# Patient Record
Sex: Female | Born: 1945 | Race: Black or African American | Hispanic: No | Smoking: Current every day smoker
Health system: Southern US, Community
[De-identification: ages and names within clinical notes are randomized; demographics above are authoritative.]

## PROBLEM LIST (undated history)

## (undated) DIAGNOSIS — J449 Chronic obstructive pulmonary disease, unspecified: Secondary | ICD-10-CM

## (undated) DIAGNOSIS — N289 Disorder of kidney and ureter, unspecified: Secondary | ICD-10-CM

## (undated) DIAGNOSIS — K7460 Unspecified cirrhosis of liver: Secondary | ICD-10-CM

## (undated) HISTORY — PX: CHOLECYSTECTOMY: SHX55

---

## 1998-11-14 ENCOUNTER — Emergency Department (HOSPITAL_COMMUNITY): Admission: EM | Admit: 1998-11-14 | Discharge: 1998-11-14 | Payer: Self-pay | Admitting: Emergency Medicine

## 1998-11-14 ENCOUNTER — Encounter: Payer: Self-pay | Admitting: Emergency Medicine

## 2001-02-13 HISTORY — PX: ORIF ANKLE FRACTURE BIMALLEOLAR: SUR920

## 2001-10-10 ENCOUNTER — Emergency Department (HOSPITAL_COMMUNITY): Admission: EM | Admit: 2001-10-10 | Discharge: 2001-10-10 | Payer: Self-pay | Admitting: Physical Therapy

## 2001-11-06 ENCOUNTER — Ambulatory Visit (HOSPITAL_BASED_OUTPATIENT_CLINIC_OR_DEPARTMENT_OTHER): Admission: RE | Admit: 2001-11-06 | Discharge: 2001-11-06 | Payer: Self-pay | Admitting: Orthopedic Surgery

## 2002-01-07 ENCOUNTER — Encounter: Admission: RE | Admit: 2002-01-07 | Discharge: 2002-03-14 | Payer: Self-pay | Admitting: Orthopedic Surgery

## 2002-09-02 ENCOUNTER — Encounter: Admission: RE | Admit: 2002-09-02 | Discharge: 2002-09-02 | Payer: Self-pay | Admitting: Family Medicine

## 2002-09-02 ENCOUNTER — Encounter: Payer: Self-pay | Admitting: Family Medicine

## 2003-06-08 ENCOUNTER — Inpatient Hospital Stay (HOSPITAL_COMMUNITY): Admission: EM | Admit: 2003-06-08 | Discharge: 2003-06-11 | Payer: Self-pay | Admitting: Emergency Medicine

## 2003-10-15 ENCOUNTER — Emergency Department (HOSPITAL_COMMUNITY): Admission: EM | Admit: 2003-10-15 | Discharge: 2003-10-15 | Payer: Self-pay | Admitting: Emergency Medicine

## 2003-10-23 ENCOUNTER — Encounter: Admission: RE | Admit: 2003-10-23 | Discharge: 2003-12-09 | Payer: Self-pay | Admitting: Orthopedic Surgery

## 2003-11-17 ENCOUNTER — Emergency Department (HOSPITAL_COMMUNITY): Admission: EM | Admit: 2003-11-17 | Discharge: 2003-11-17 | Payer: Self-pay | Admitting: Emergency Medicine

## 2003-12-24 ENCOUNTER — Encounter: Admission: RE | Admit: 2003-12-24 | Discharge: 2003-12-24 | Payer: Self-pay | Admitting: General Surgery

## 2003-12-31 ENCOUNTER — Encounter: Admission: RE | Admit: 2003-12-31 | Discharge: 2004-01-29 | Payer: Self-pay | Admitting: Orthopedic Surgery

## 2004-01-25 ENCOUNTER — Inpatient Hospital Stay (HOSPITAL_COMMUNITY): Admission: EM | Admit: 2004-01-25 | Discharge: 2004-01-28 | Payer: Self-pay | Admitting: Emergency Medicine

## 2004-01-25 ENCOUNTER — Ambulatory Visit: Payer: Self-pay | Admitting: Internal Medicine

## 2004-01-26 ENCOUNTER — Encounter (INDEPENDENT_AMBULATORY_CARE_PROVIDER_SITE_OTHER): Payer: Self-pay | Admitting: *Deleted

## 2004-03-01 ENCOUNTER — Ambulatory Visit: Payer: Self-pay | Admitting: Internal Medicine

## 2004-11-10 ENCOUNTER — Emergency Department (HOSPITAL_COMMUNITY): Admission: EM | Admit: 2004-11-10 | Discharge: 2004-11-10 | Payer: Self-pay | Admitting: Emergency Medicine

## 2005-04-30 IMAGING — CR DG BE W/ AIR HIGH DENSITY
10 series · 10 of 10 positions shown · non-contrast
Comparison: none

CLINICAL DATA: Left lower quadrant abdominal pain.  Evaluate for cecal lesion questioned on recent  CT [REDACTED].
KUB:
A preliminary film of the abdomen shows a nonspecific bowel gas pattern.  Calcifications in the right upper quadrant are consistent with gallstones noted on recent CT of the abdomen. 
AIR CONTRAST BARIUM ENEMA:
Double contrast barium enema was performed. The entire colon is well visualized.  The area questioned involving the base of the cecum medially is well visualized and appears completely normal on BE with air.  The terminal ileum fills normally.  Only a single diverticulum is noted in the descending colon.  There is also a small diverticulum in the terminal ileum.  No persistent polypoid lesion or constricting lesion is seen.

[run (1 of 7)]
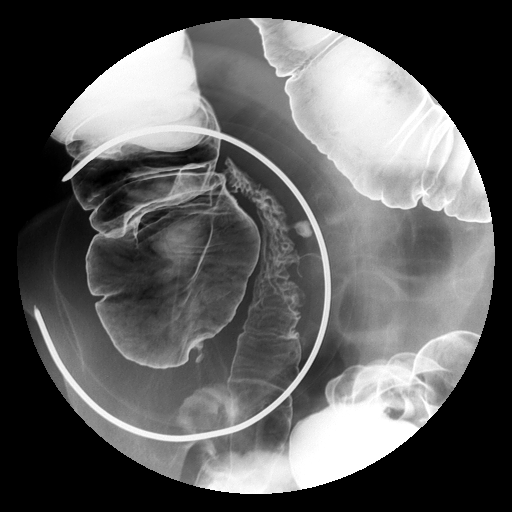

[run (2 of 7)]
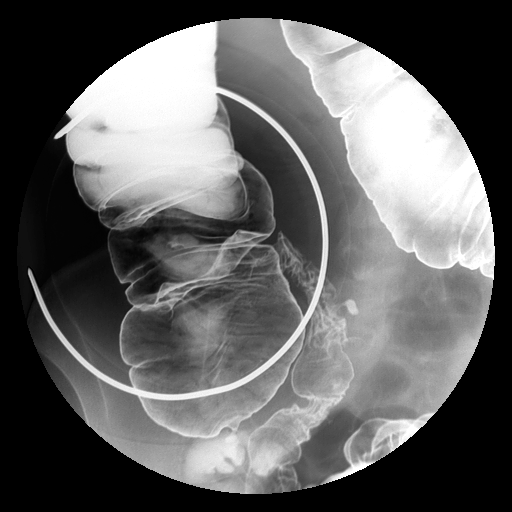

[run (3 of 7)]
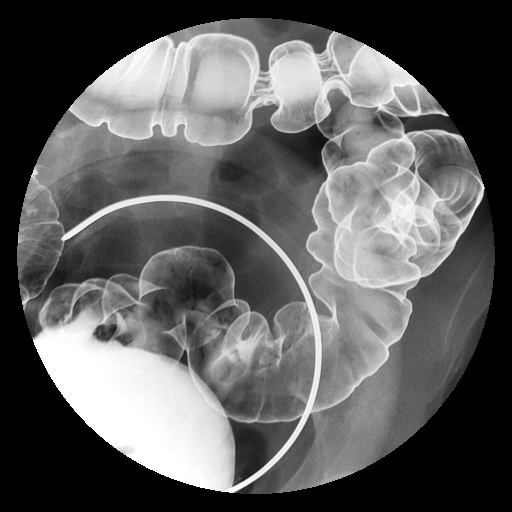

[run (4 of 7)]
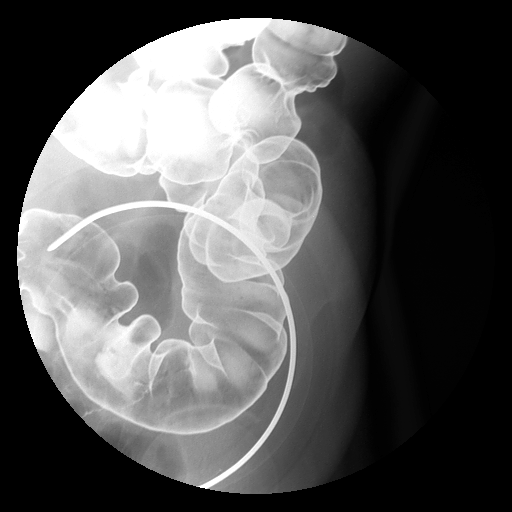

[run (5 of 7)]
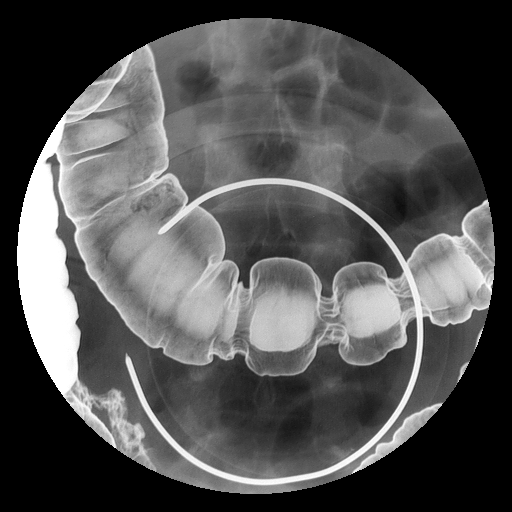

[run (6 of 7)]
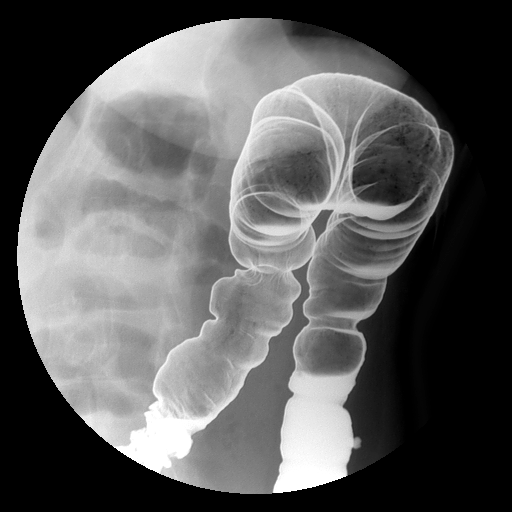

[run (7 of 7)]
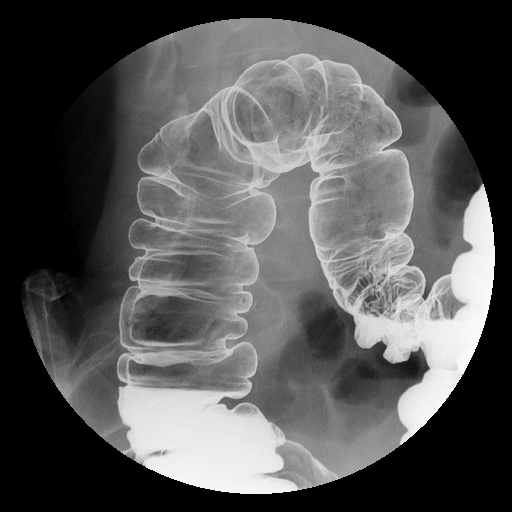

[view not recorded (1 of 3)]
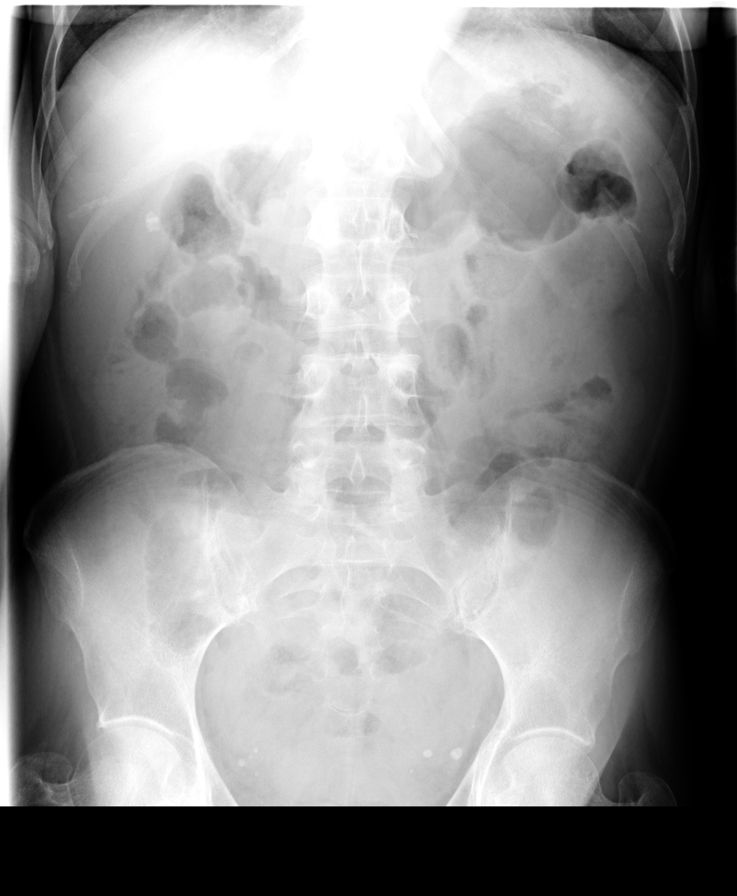

[view not recorded (2 of 3)]
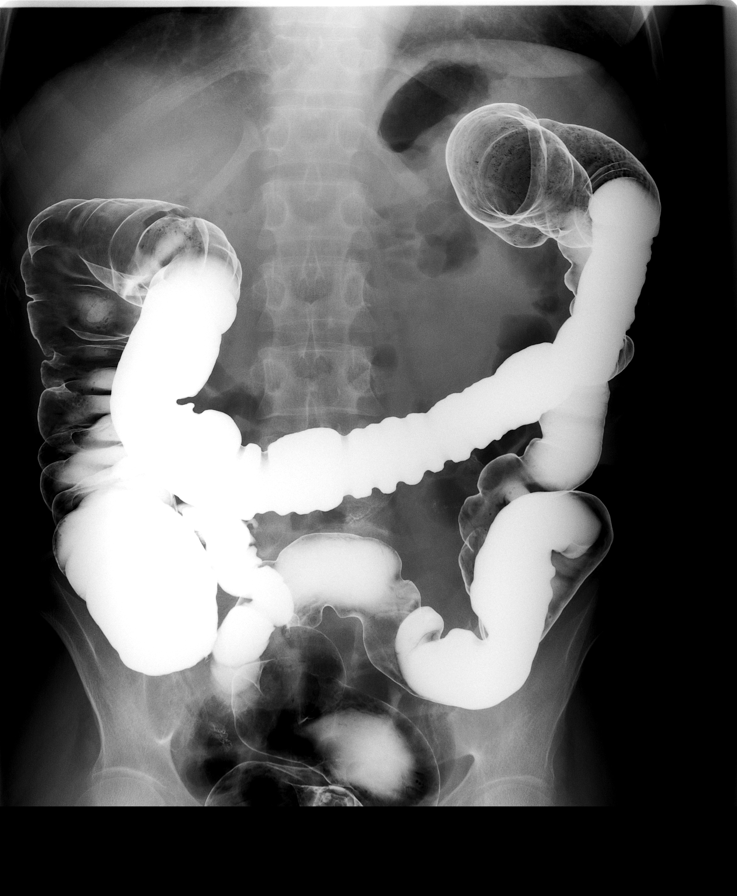

[view not recorded (3 of 3)]
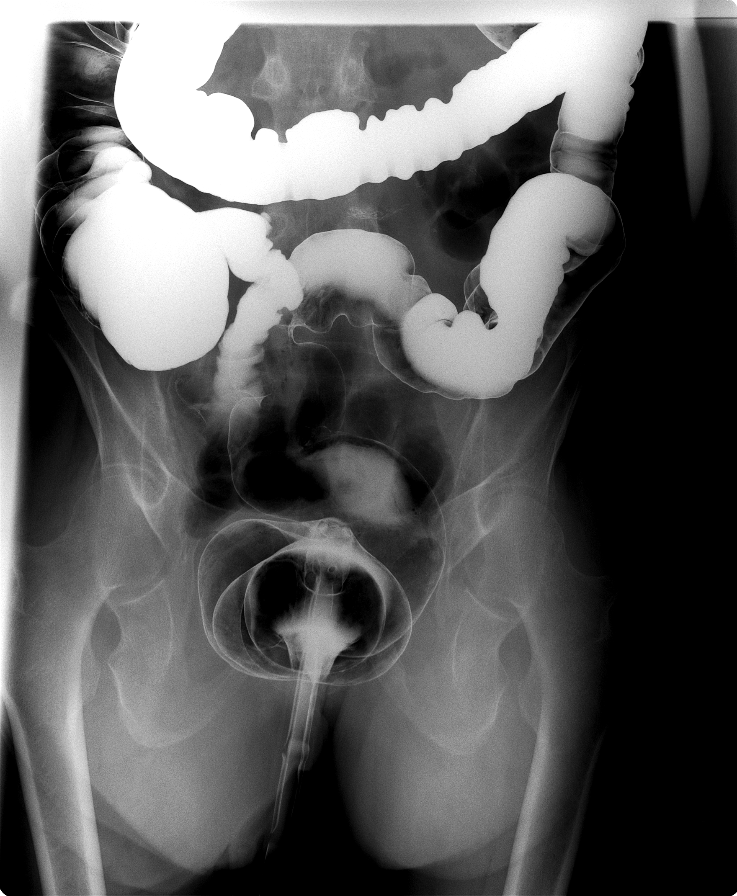

[10 of 10 positions shown; findings below may reference images not displayed]

IMPRESSION: 1.  Only a single descending colon diverticulum is noted.
2.   The base of the cecum is well visualized and appears normal at the site questioned on recent CT of the abdomen, as noted above.

## 2005-08-28 ENCOUNTER — Inpatient Hospital Stay (HOSPITAL_COMMUNITY): Admission: EM | Admit: 2005-08-28 | Discharge: 2005-08-29 | Payer: Self-pay | Admitting: Emergency Medicine

## 2005-08-29 ENCOUNTER — Encounter (INDEPENDENT_AMBULATORY_CARE_PROVIDER_SITE_OTHER): Payer: Self-pay | Admitting: Specialist

## 2005-08-29 ENCOUNTER — Ambulatory Visit: Payer: Self-pay | Admitting: Internal Medicine

## 2005-12-14 ENCOUNTER — Emergency Department (HOSPITAL_COMMUNITY): Admission: EM | Admit: 2005-12-14 | Discharge: 2005-12-14 | Payer: Self-pay | Admitting: Emergency Medicine

## 2006-07-03 ENCOUNTER — Emergency Department (HOSPITAL_COMMUNITY): Admission: EM | Admit: 2006-07-03 | Discharge: 2006-07-03 | Payer: Self-pay | Admitting: Emergency Medicine

## 2007-01-01 ENCOUNTER — Emergency Department (HOSPITAL_COMMUNITY): Admission: EM | Admit: 2007-01-01 | Discharge: 2007-01-01 | Payer: Self-pay | Admitting: Emergency Medicine

## 2007-03-08 ENCOUNTER — Ambulatory Visit (HOSPITAL_COMMUNITY): Admission: RE | Admit: 2007-03-08 | Discharge: 2007-03-08 | Payer: Self-pay | Admitting: General Surgery

## 2007-03-08 ENCOUNTER — Encounter (INDEPENDENT_AMBULATORY_CARE_PROVIDER_SITE_OTHER): Payer: Self-pay | Admitting: General Surgery

## 2007-07-28 ENCOUNTER — Emergency Department (HOSPITAL_COMMUNITY): Admission: EM | Admit: 2007-07-28 | Discharge: 2007-07-28 | Payer: Self-pay | Admitting: Emergency Medicine

## 2007-08-22 ENCOUNTER — Emergency Department (HOSPITAL_COMMUNITY): Admission: EM | Admit: 2007-08-22 | Discharge: 2007-08-22 | Payer: Self-pay | Admitting: Emergency Medicine

## 2007-11-02 ENCOUNTER — Emergency Department (HOSPITAL_COMMUNITY): Admission: EM | Admit: 2007-11-02 | Discharge: 2007-11-02 | Payer: Self-pay | Admitting: Emergency Medicine

## 2008-03-08 ENCOUNTER — Emergency Department (HOSPITAL_COMMUNITY): Admission: EM | Admit: 2008-03-08 | Discharge: 2008-03-08 | Payer: Self-pay | Admitting: Emergency Medicine

## 2009-07-14 IMAGING — CR DG KNEE COMPLETE 4+V*R*
4 series · 4 of 4 positions shown · non-contrast
Comparison: 07/28/2007

CLINICAL DATA: Fell

RIGHT KNEE - COMPLETE 4+ VIEW

[t knee ap right]
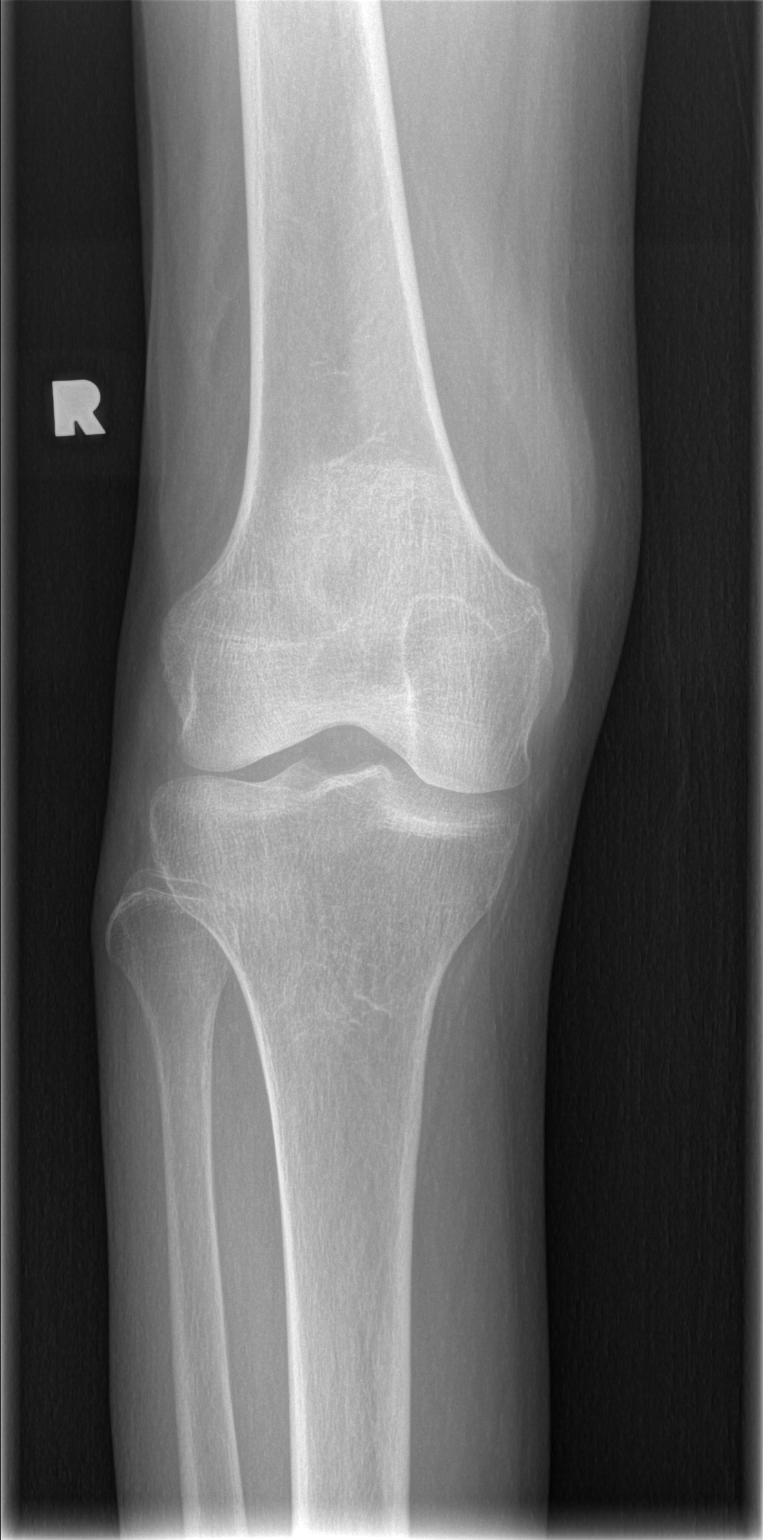

[t knee oblique right (1 of 2)]
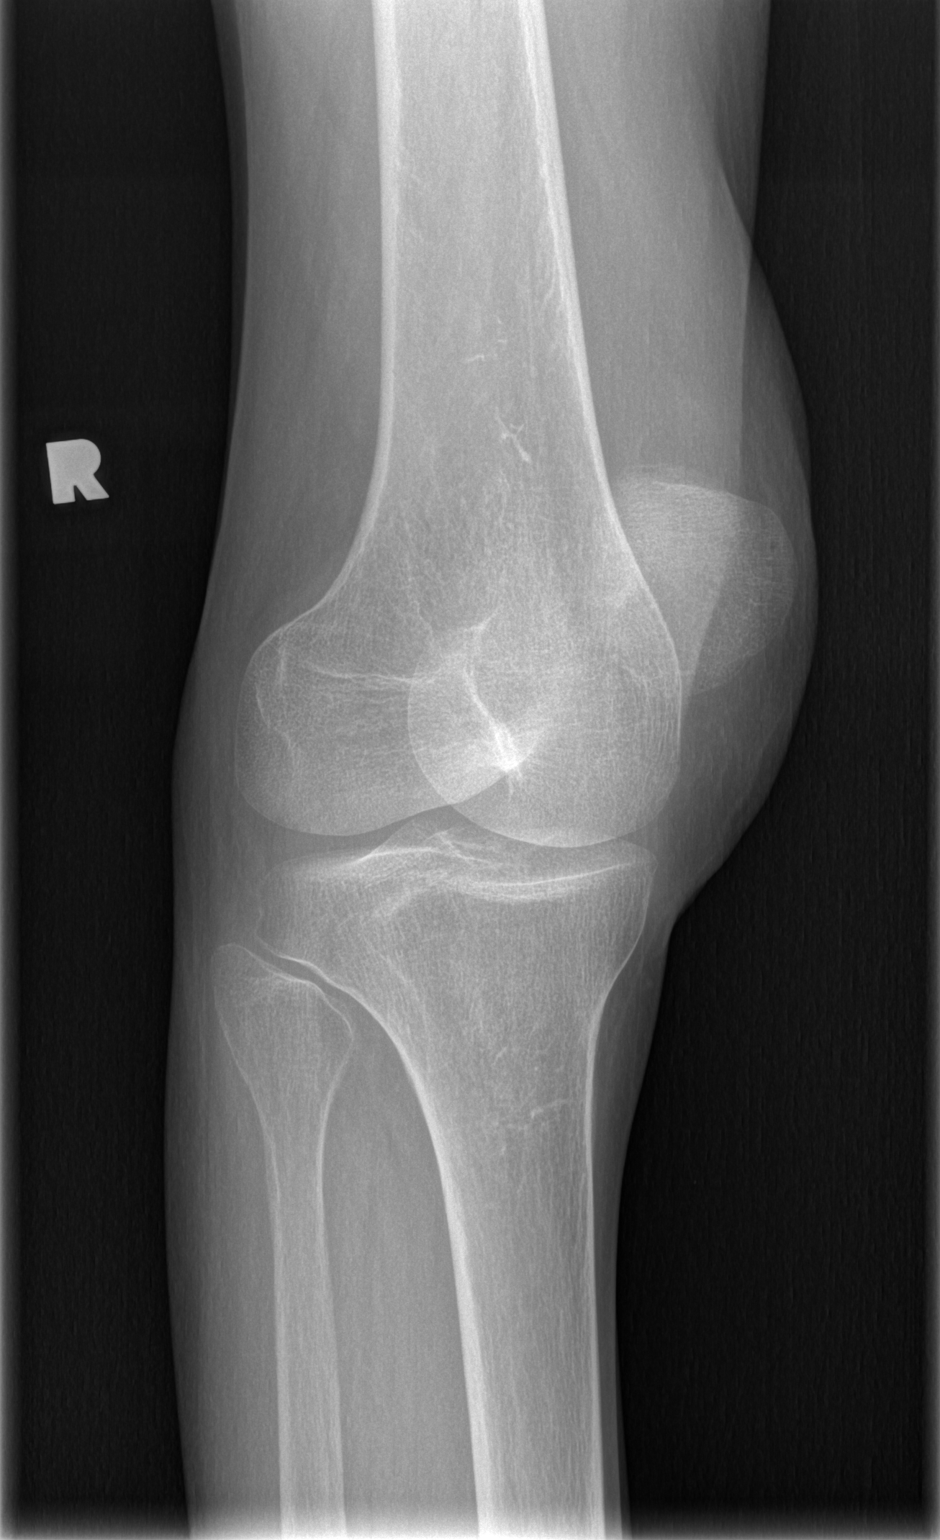

[t knee oblique right (2 of 2)]
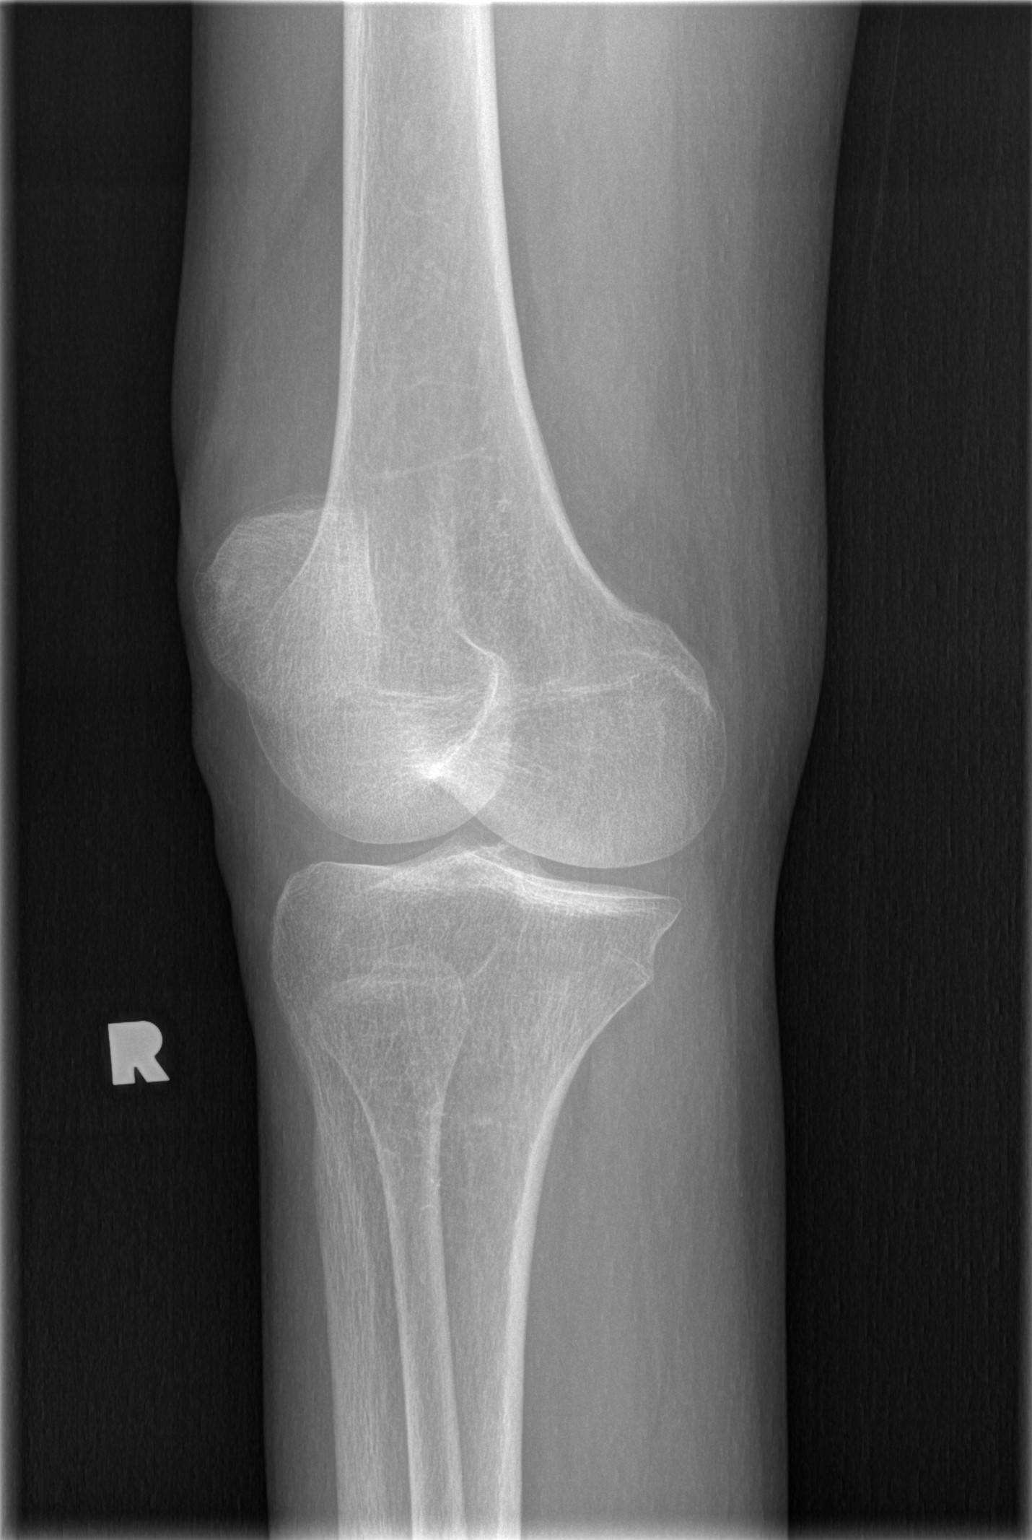

[t knee lat right]
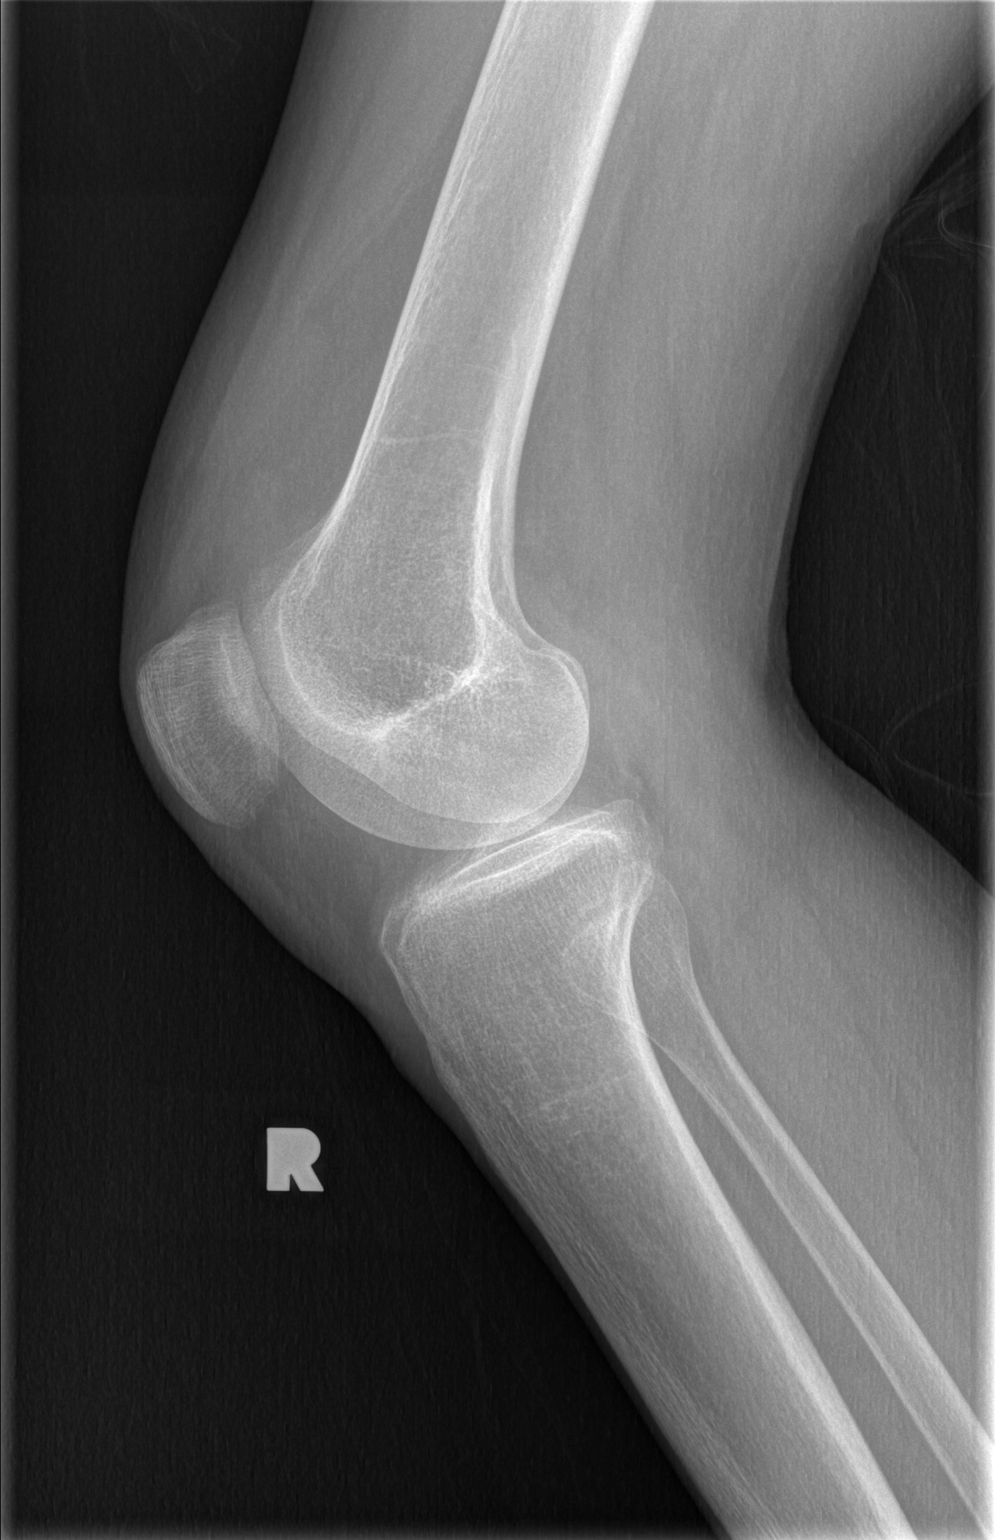

[4 of 4 positions shown; findings below may reference images not displayed]

FINDINGS: No effusion.  Mild osteopenia. Negative for fracture,
dislocation, or other acute abnormality.  Normal alignment and
mineralization. No significant degenerative change.  Regional soft
tissues unremarkable.
IMPRESSION: 1.  Negative for fracture or other acute bone injury.
2.  Osteopenia.

## 2010-05-11 ENCOUNTER — Emergency Department (HOSPITAL_COMMUNITY)
Admission: EM | Admit: 2010-05-11 | Discharge: 2010-05-11 | Disposition: A | Discharge status: Home / Self Care | Payer: Self-pay | Attending: Emergency Medicine | Admitting: Emergency Medicine

## 2010-05-11 DIAGNOSIS — H5710 Ocular pain, unspecified eye: Secondary | ICD-10-CM | POA: Insufficient documentation

## 2010-05-11 DIAGNOSIS — IMO0002 Reserved for concepts with insufficient information to code with codable children: Secondary | ICD-10-CM | POA: Insufficient documentation

## 2010-05-11 DIAGNOSIS — S058X9A Other injuries of unspecified eye and orbit, initial encounter: Secondary | ICD-10-CM | POA: Insufficient documentation

## 2010-05-11 DIAGNOSIS — H538 Other visual disturbances: Secondary | ICD-10-CM | POA: Insufficient documentation

## 2010-06-28 NOTE — Consult Note (Signed)
[name], [name]          ACCOUNT NO.:  [id number]   MEDICAL RECORD NO.:  [id number]          PATIENT TYPE:  EMS   LOCATION:  ED                           FACILITY:  [facility]   PHYSICIAN:  Angelia Mould. Derrell Lolling, M.D.DATE OF BIRTH:  10/13/1945   DATE OF CONSULTATION:  01/01/2007  DATE OF DISCHARGE:  01/01/2007                                 CONSULTATION   REFERRING PHYSICIAN:  Dr. Bethann Berkshire.   CHIEF COMPLAINT:  Abdominal pain.   HISTORY OF PRESENT ILLNESS:  This is a 65 year old black female with  gallstones.  I was asked to see her by the emergency department  physician and the emergency department PA.   The patient states that she has had 2 or 3 episodes of upper abdominal  pain over the past 12 months.  She was evaluated in [place] and told she  had gallstones and was advised to have surgery, but declined.  She now  presents to the [facility] Emergency Room with a 48-hour history of  intermittent upper abdominal pain, some nausea, but no vomiting.  She  denies fever or chills.  She is constipated.  She has never had jaundice  or hepatitis, never had any cardiac or pulmonary, or renal disease.   Ultrasound tonight shows that she has gallstones.  Her common bile duct  is not dilated.  She has borderline wall thickening.  Lab work showed a  white blood cell count of 7400 with a normal differential, lipase of 51,  and normal liver function tests, except for an AST of 83.   The patient states that she would like to go home and defer her  cholecystectomy until December.   PAST HISTORY:  1. Upper endoscopy in 2005 showing is severe esophagitis and a      possible Mallory-Weiss tear by Dr. Dorena Cookey.  2. Upper endoscopy but Dr. Lina Sar in 2007 showing a gastric      ulcer.  3. She has undergone a total abdominal hysterectomy and appendectomy.  4. She has had a fracture of her left ankle.  5. She fell and fractured her pelvis, but did not require surgery.  6. She has  hypertension.   CURRENT MEDICATIONS:  None.   DRUG ALLERGIES:  HYDROCODONE causes nausea.   FAMILY HISTORY:  Father died of a myocardial infarction and  hypertension; mother also died of a myocardial infarction and  hypertension.   SOCIAL HISTORY:  The patient is a widow.  She has 5 children; 1 child  died at AIDS.  She is unemployed.  She used to see Dr. Bruna Potter, but  really does not have a primary care physician.  She smokes 1 pack of  cigarettes per day.  She drinks a pint of alcohol or more per week.  Denies street drugs.  She lives with her niece and a friend.   REVIEW OF SYSTEMS:  Fifteen-system review of systems is performed and is  noncontributory except as described above.   PHYSICAL EXAMINATION:  GENERAL:  A thin, middle-aged black female who is  in no distress.  Her daughter is with her throughout the encounter.  VITAL SIGNS:  Temperature is 97.8, pulse 88 and regular, respirations  20, blood pressure 160/94.  HEENT:  Eyes:  Sclerae are clear.  Extraocular is intact.  Ears, nose,  mouth and throat, nose, lips, tongue and oropharynx are without gross  lesions.  NECK:  Supple, nontender.  No mass.  No jugular distention.  LUNGS:  Clear to auscultation.  No chest wall tenderness.  HEART:  Regular rate and rhythm.  No murmur.  Radial and femoral pulses  are palpable.  ABDOMEN:  Soft, not distended.  She does have some tenderness in the  epigastrium, but no peritoneal signs, guarding or mass.  She has a well-  healed Pfannenstiel incision.  There is no hernia or lower abdominal  mass.  EXTREMITIES:  She moves all 4 extremities well without pain or  deformity.  No peripheral edema.  NEUROLOGIC:  No gross motor or sensory deficits.   DATA ORDERED AND REVIEWED:  I reviewed her lab work and ultrasound as  described above.   ASSESSMENT:  1. Chronic cholecystitis with cholelithiasis.  Recurrent biliary      colic.  She will eventually need cholecystectomy.  2. Tobacco  abuse.  3. Hypertension.  4. History of recurrent upper gastrointestinal bleed, etiology      unclear, question alcohol etiology.  5. Status post abdominal hysterectomy and appendectomy.   PLAN:  1. I offered to the patient that she could be admitted to the hospital      and have her cholecystectomy at this time or defer it until a later      time.  She very clearly told me that she did not want to have this      done at this time and wanted to wait until late December after her      birthday.  I felt that this was reasonable, unless her symptoms      recurred.  2. The patient was advised that if her pain recurs, that she should      have cholecystectomy sooner to avoid complicating factors.  3. I am going to strongly advise her on a no-alcohol/no-tobacco      lifestyle.   I told her to eat a low-fat diet.   She was given a prescription for Cipro and Tylox.   She was given my business card and asked to call my office tomorrow and  set up an appointment to see me within the next few days to schedule  elective surgery.  She states that she will comply with these  recommendations.      Angelia Mould. Derrell Lolling, M.D.  Electronically Signed     HMI/MEDQ  D:  01/01/2007  T:  01/02/2007  Job:  [id number]

## 2010-06-28 NOTE — Op Note (Signed)
NAMESUMMERLYNN, GLAUSER          ACCOUNT NO.:  [id number]   MEDICAL RECORD NO.:  [id number]          PATIENT TYPE:  AMB   LOCATION:  DAY                          FACILITY:  [facility]   PHYSICIAN:  Angelia Mould. Derrell Lolling, M.D.DATE OF BIRTH:  [birth year]   DATE OF PROCEDURE:  03/08/2007  DATE OF DISCHARGE:                               OPERATIVE REPORT   PREOPERATIVE DIAGNOSIS:  Chronic cholecystitis with cholelithiasis.   POSTOPERATIVE DIAGNOSIS:  Chronic cholecystitis with cholelithiasis.   OPERATION PERFORMED:  Laparoscopic cholecystectomy with intraoperative  cholangiogram.   SURGEON:  Dr. Claud Kelp.   FIRST ASSISTANT:  Dr. Consuello Bossier.   OPERATIVE INDICATIONS:  This is a 65 year old black female who I  evaluated in the emergency room at [facility] approximately 2 months  ago.  She was having recurrent biliary colic.  This has been going on  for several years.  She declined admission at that time.  She has been  further evaluated in the office, and surgery for her gallbladder has  been recommended.  She is now brought to operating room electively.   OPERATIVE FINDINGS:  Her liver was enlarged but was not obviously  cirrhotic.  I did not see any evidence of portal hypertension.  The  gallbladder was thin-walled.  There fairly extensive adhesions to the  body and infundibulum of the gallbladder.  The cholangiogram was  basically normal showing somewhat tortuous common duct but normal  intrahepatic and extrahepatic bile duct anatomy, no filling defects, and  no obstruction with good flow of contrast into the duodenum.  There were  some adhesions in the midline beginning around the umbilicus.  We had a  little bit of bleeding from of the omental vessels which required  control with metal clips but really did not result in more than about 20  mL of blood loss or less.  We had complete hemostasis at the end of the  case.  The lower midline adhesions extended all the way down in  the  pelvis, presumably from her previous hysterectomy.  We did not see any  primary disease process of the large intestine or small intestine.   OPERATIVE TECHNIQUE:  Following the induction of general endotracheal  anesthesia, the patient's abdomen was prepped and draped in a sterile  fashion.  The patient was identified as the correct patient and correct  procedure.  Intravenous antibiotics were given.  Marcaine 0.5% with  epinephrine was used as a local infiltration anesthetic.  A vertically  oriented incision was made inside the lower rim of the umbilicus.  The  fascia was incised in the midline and the abdominal cavity entered under  direct vision.  A 10-mm Hassan trocar was inserted and secured with a  pursestring suture of 0 Vicryl.  Pneumoperitoneum was created.  Video  cam was inserted with visualization and findings as described above.  A  5-mm trocar was placed in the subxiphoid region and two 5-mm trocars  placed in the right upper quadrant.  The gallbladder fundus was  elevated.  We spent some time taking adhesions down which were soft  chronic adhesions.  We ultimately dissected out  the infundibulum of the  gallbladder and retracted that laterally.  We continued our dissection  until we had isolated the cystic duct and the cystic artery.  We  isolated the cystic artery as it went onto the wall of the gallbladder,  secured it with multiple metal clips and divided it.  This created a  large window behind the cystic duct.  A cholangiogram catheter was  placed in the cystic duct and a cholangiogram obtained using the C-arm.  The cholangiogram was normal as described above.  The cholangiogram  catheter was removed.  The cystic duct was secured with multiple metal  clips and divided.  The gallbladder was dissected from its bed, placed  in a specimen bag and removed.  The operative field was copiously  irrigated.  At the completion of the case, the irrigation fluid was   completely clear.  There was no bleeding and no bile leak whatsoever  anywhere in the operative field.  We checked the periumbilical area one  more time, and the adhesions looked fine.  There was no bleeding or  blood collections.  Trocars were removed under direct vision.  There was  no bleeding from trocar sites.  Pneumoperitoneum was released.  The  fascia at the umbilicus was closed with 0 Vicryl sutures.  Skin  incisions were closed with subcuticular sutures of 4-0 Monocryl and  Steri-Strips.  Clean bandages were placed and the patient taken to the  recovery in stable condition.  Estimated blood loss was about 20 mL.  Complications none.  Sponge, needle and instrument counts were correct.      Angelia Mould. Derrell Lolling, M.D.  Electronically Signed     HMI/MEDQ  D:  03/08/2007  T:  03/08/2007  Job:  [id number]

## 2010-07-01 NOTE — Consult Note (Signed)
NAMEMALLERIE, [name]          ACCOUNT NO.:  [id number]   MEDICAL RECORD NO.:  [id number]          PATIENT TYPE:  INP   LOCATION:  4731                         FACILITY:  [facility]   PHYSICIAN:  John C. Madilyn Fireman, M.D.    DATE OF BIRTH:  1945-08-29   DATE OF CONSULTATION:  01/25/2004  DATE OF DISCHARGE:                                   CONSULTATION   REASON FOR CONSULTATION:  Dysphagia and weight loss with one episode of  vomiting described as dark.   HISTORY OF PRESENT ILLNESS:  The patient is a 65 year old black female who  was admitted today after a syncopal episode in the bathroom not associated  with any bowel movements but having shortly after vomiting some blackish  material.  She also gives a history of progressive solid greater than liquid  dysphagia over the last 2-3 months with decreasing p.o. intake and a 20  pound weight loss.  She presented with upper GI bleeding in April and was  found to have blood in her stomach and an ill defined mucosal lesion at the  GE junction thought to represent an ulcer or Mallory-Weiss tear.  She  improved on Protonix and was doing well at the time of office follow up.  She was treated with Protonix for 2-3 months.  She first noticed her  dysphagia about 2-3 months ago and states it has been gradually worsening.  She has also felt generally weaker and has had increasing fatigue recently.   PAST MEDICAL HISTORY:  1.  Bronchitis.  2.  Mild anemia.   PAST SURGICAL HISTORY:  Hysterectomy and foot surgery.   SOCIAL HISTORY:  The patient lives with a boyfriend.  She is widowed.  She  has five children.  She drinks alcohol on the weekend and admits to chronic  cigarette smoking.  She takes [employer] fairly frequently.   PHYSICAL EXAMINATION:  GENERAL:  Somewhat thin black female alert and oriented in no acute  distress.  HEENT:  Unremarkable.  HEART:  Regular rate and rhythm without murmurs.  LUNGS:  Clear.  ABDOMEN:  Soft,  nondistended, with normal active bowel sounds, no  hepatosplenomegaly, masses, or guarding.  RECTAL:  The results of admission rectal exam not currently available.   LABORATORY DATA:  PH 7.569, pCO2 43, bicarbonate 39.  WBC 7500, hemoglobin  9, hematocrit 26.3, platelets 112,000.  Protime 13.5.   IMPRESSION:  Progressive solid greater than liquid dysphagia with weight  loss, differential diagnoses include esophageal stricture versus  gastroesophageal junction neoplasm versus achalasia versus possible  infectious esophagitis or esophageal ulcer.   PLAN:  Will proceed with esophagoscopy tomorrow.       JCH/MEDQ  D:  01/25/2004  T:  01/25/2004  Job:  [id number]   cc:   Ileana Roup, M.D.  [address] [place] [address]  Fax: [phone]

## 2010-07-01 NOTE — Op Note (Signed)
NAME:  [name], [name]                    ACCOUNT NO.:  [id number]   MEDICAL RECORD NO.:  [id number]                   PATIENT TYPE:  EMS   LOCATION:  MAJO                                 FACILITY:  [facility]   PHYSICIAN:  John C. Madilyn Fireman, M.D.                 DATE OF BIRTH:  11-04-1945   DATE OF PROCEDURE:  06/08/2003  DATE OF DISCHARGE:                                 OPERATIVE REPORT   PROCEDURE PERFORMED:  Esophagogastroduodenoscopy.   ENDOSCOPIST:  Barrie Folk, M.D.   INDICATIONS FOR PROCEDURE:  Upper gastrointestinal bleeding.   DESCRIPTION OF PROCEDURE:  The patient was placed in the left lateral  decubitus position and placed on the pulse monitor with continuous low-flow  oxygen delivered by nasal cannula.  She was sedated with 35 mcg of IV  fentanyl and 4 mg of IV Versed.  The Olympus video endoscope was advanced  under direct vision into the oropharynx and esophagus.  The esophagus was  straight and of normal caliber with the squamocolumnar line at 38 cm.  There  appeared to be an esophageal ulcer and also an avulsed area that may have  represented a Mallory-Weiss tear.  It did not appear to be bleeding at the  time of the procedure but there was significant blood in the stomach and  after inspection of the stomach and duodenum, there was some retching on the  part of the patient and there appeared to be some oozing from near the  gastroesophageal junction that could not be localized.  The stomach was  entered and a moderately large amount of dark maroon-colored liquid was  suctioned from the fundus.  I could not absolutely suction all of this due  to some small dark clots and ulcer disease in the stomach.  Also the patient  was becoming combative and was difficult to sedate but could not tolerated  increased doses of sedatives due to relative hypotension.  The visualized  portions of the stomach appeared normal including the fundus, body, and  antrum.  The pylorus was  nondeformed and easily allowed passed of the  endoscope tip into the duodenum.  There was some intense duodenitis at the  junction of the bulb and second portion with no ulcer seen and really no  blood seen in the first or second portion of the duodenum.  The scope was  then withdrawn back to the gastroesophageal junction.  The patient was  gagging and regurgitated some of the old blood in the stomach and there was  some slight oozing from the gastroesophageal junction.  I suspect she had a  Mallory-Weiss tear and/or an esophageal ulcer __________ seen on initial  intubation.  It did not appear that there was any high grade bleeding going  on and I elected to terminate the procedure at that point and treat him  medically while getting blood transfusion.  The scope was withdrawn and the  patient was  returned to the recovery room in stable condition.  The patient  tolerated the procedure relatively well.  There were no immediate  complications.   IMPRESSION:  Recent upper gastrointestinal bleeding with source suspected  gastroesophageal junction with one small ulcer seen and possible Mallory-  Weiss tear.   PLAN:  Admit for treatment with proton pump inhibitor and Carafate.  Will  check Helicobacter pylori antibody and will also transfuse.                                              John C. Madilyn Fireman, M.D.   JCH/MEDQ  D:  06/08/2003  T:  06/08/2003  Job:  [id number]   cc:   Dr. Bruna Potter

## 2010-07-01 NOTE — Discharge Summary (Signed)
NAME:  [name], [name]                    ACCOUNT NO.:  [id number]   MEDICAL RECORD NO.:  [id number]                   PATIENT TYPE:  INP   LOCATION:  3036                                 FACILITY:  [facility]   PHYSICIAN:  John C. Madilyn Fireman, M.D.                 DATE OF BIRTH:  09/15/1945   DATE OF ADMISSION:  06/08/2003  DATE OF DISCHARGE:  06/11/2003                                 DISCHARGE SUMMARY   HISTORY OF ILLNESS:  The patient is a 65 year old black female who was  admitted, on June 08, 2003, with __________  black stool, hemoglobin of  5.9, BUN 29, creatinine 0.8 with tachycardia.  For details, please see  admission history and physical.   COURSE IN THE HOSPITAL:  The patient underwent EGD which showed diffuse  bleeding from the GE junction, unable to define a discrete source, felt  probably most likely to be from a Mallory-Weiss laceration.  The patient was  started on a proton pump inhibitor and Carafate.  She was also transfused  three units of packed red blood cells and had H. pylori antibody done which  was negative.  After three units of packed red blood cells, her hemoglobin  was 10.5, remained stable, and was 11.2 on discharge.  On the second  hospital day, she was doing better with no abdominal pain or vomiting.  On  June 10, 2003, her diet was advanced and she was switched to p.o. Protonix.  On April 28, she had no stool, no abdominal pain.  Her hemoglobin was  stable.  She was discharged to outpatient followup.   DISCHARGE DIAGNOSES:  1. Upper gastrointestinal bleed, probably from a Mallory-Weiss tear or     esophageal ulcer.  2. Duodenitis.   DISCHARGE MEDICINES:  Protonix 40 mg b.i.d.   FOLLOW UP:  Dr. Madilyn Fireman, in the office, in 2-3 weeks.                                                John C. Madilyn Fireman, M.D.    JCH/MEDQ  D:  06/30/2003  T:  06/30/2003  Job:  [id number]

## 2010-07-01 NOTE — Op Note (Signed)
   [name], [name]                    ACCOUNT NO.:  [id number]   MEDICAL RECORD NO.:  [id number]                   PATIENT TYPE:  AMB   LOCATION:  DSC                                  FACILITY:  [facility]   PHYSICIAN:  Mila Homer. Sherlean Foot, M.D.              DATE OF BIRTH:  Apr 30, 1945   DATE OF PROCEDURE:  11/06/2001  DATE OF DISCHARGE:                                 OPERATIVE REPORT   PREOPERATIVE DIAGNOSES:  Left bimalleolar ankle fracture.   POSTOPERATIVE DIAGNOSES:  Left bimalleolar ankle fracture.   OPERATION PERFORMED:  Left ankle open reduction internal fixation.   SURGEON:  Mila Homer. Sherlean Foot, M.D.   ASSISTANT:  None.   ANESTHESIA:  General.   INDICATIONS FOR PROCEDURE:  The patient is five days status post bimalleolar  ankle fracture and after medical clearance and informed consent she was  taken to the operating room.   DESCRIPTION OF PROCEDURE:  The patient was laid supine and administered  general anesthesia.  The left lower extremity was prepped and draped in the  usual sterile fashion.  [name] #15 blade was used to make [name] straight lateral  incision over the lateral malleolus and continued directly down to bone on  the inferior aspect, soft tissue dissection in the proximal aspect to make  sure we did not injure the superficial peroneal nerve.  We then cleared off  the lateral aspect of the lateral malleolus and did direct plate fixation  and plate reduction of the lateral malleolus fracture.  We placed three  bicortical screws proximally, one bicortical and one cancellous screw  distally and checked under C-arm imaging and it was anatomic.  Turned my  attention to the medial side.  There was so much swelling and even inferior  fracture blister that I chose to use [name] percutaneous technique with 4-0  titanium Ace aimed screws.  I placed [name] 4-0 guidewire percutaneously under  OEC AP and lateral imaging and then placed [name] parallel one to that  posteriorly and then placed  two 35 mm partially threaded cancellous screws  there and mortise views showed anatomic reduction.  I then irrigated and  closed with interrupted 0s, 2-0s, skin staples on each side and dressed with  Adaptic, 4 x 4s, sterile Webril and Ace wrap.  I did infiltrate the lateral  wound with 10 cc of Marcaine morphine mixture with epinephrine.  Tourniquet  went down at 47 minutes.   COMPLICATIONS:  None.   DRAINS:  None.   ESTIMATED BLOOD LOSS:  Minimal.                                                Mila Homer. Sherlean Foot, M.D.   SDL/MEDQ  D:  11/06/2001  T:  11/06/2001  Job:  [id number]

## 2010-07-01 NOTE — H&P (Signed)
NAME:  [name], [name]                    ACCOUNT NO.:  [id number]   MEDICAL RECORD NO.:  [id number]                   PATIENT TYPE:  EMS   LOCATION:  MAJO                                 FACILITY:  [facility]   PHYSICIAN:  John C. Madilyn Fireman, M.D.                 DATE OF BIRTH:  11-20-1945   DATE OF ADMISSION:  06/08/2003  DATE OF DISCHARGE:                                HISTORY & PHYSICAL   CHIEF COMPLAINT:  Black stools.   HISTORY OF PRESENT ILLNESS:  The patient is a 65 year old, African-American  female who developed bloody or coffee-grounds emesis today and noted black  stool and was apparently near syncopal, according to EMS, and was brought to  the emergency room.  She denied any abdominal pain.  She was initially  hypotensive with a hemoglobin of 5.9, black heme positive stool with a BUN  of 29 and creatinine 0.8.  She had normal amylase and negative detectable  alcohol.  Platelet count was 116,000.  She was tachycardic in the 120s and  initially systolic blood pressure was in the 80s, but responded to fluid  challenge.  She denies any prior history of GI bleeding, peptic ulcer  disease and really has no significant past medical history.   PAST MEDICAL HISTORY:  History of bronchitis.   PAST SURGICAL HISTORY:  1. Hysterectomy in 1979.  2. Foot surgery in 2003.   SOCIAL HISTORY:  The patient lives with her boyfriend.  She has five  children.  She drinks liquor on the weekends.  She admits to cigarette  smoking and takes Marlin Canary powders fairly frequently.   MEDICATIONS:  None.   ALLERGIES:  None.   FAMILY HISTORY:  Both parents died of heart disease.  No family history of  GI malignancy or ulcer disease.   PHYSICAL EXAMINATION:  GENERAL:  Well-developed, well-nourished, African-  American female in no acute distress.  VITAL SIGNS:  Blood pressure 92/68, pulse 120.  HEART:  Regular rate and rhythm without murmur.  LUNGS:  Clear.  ABDOMEN:  Soft, nondistended with  normoactive bowel sounds.  No  hepatosplenomegaly, masses or guarding.   LABORATORY DATA AND X-RAY FINDINGS:  Hemoglobin 5.9, hematocrit 18.1, MCV  89.9, WBC 10,800, platelet count 116,000.   IMPRESSION:  Suspected upper gastrointestinal bleed probably due to peptic  ulcer disease induced by using nonsteroidal anti-inflammatory drugs.   PLAN:  1. Admit and transfuse.  2. Proceed with endoscopy today.                                                John C. Madilyn Fireman, M.D.    JCH/MEDQ  D:  06/08/2003  T:  06/08/2003  Job:  [id number]

## 2010-07-01 NOTE — Op Note (Signed)
[name], RANKIN          ACCOUNT NO.:  [id number]   MEDICAL RECORD NO.:  [id number]          PATIENT TYPE:  INP   LOCATION:  2922                         FACILITY:  [facility]   PHYSICIAN:  Bernette Redbird, M.D.   DATE OF BIRTH:  November 14, 1945   DATE OF PROCEDURE:  08/29/2005  DATE OF DISCHARGE:                                 OPERATIVE REPORT   PROCEDURE:  Upper endoscopy with biopsies.   INDICATION:  This is a 65 year old female who had a GI bleed about two years  ago, at which time endoscopy by Dr. Madilyn Fireman showed a possible Mallory-Weiss  tear.  She was admitted to the hospital yesterday with a history of the  abdominal pain, coffee-ground emesis, and melenic stool with a hemoglobin  after hydration of approximately 9.  She uses aspirin regularly for  treatment of pain in her knee and also drinks alcohol on a regular basis  alcohol.   FINDINGS:  Non-bleeding gastric ulcer.  Diffuse peptic erythema in the  antrum and duodenum.   DESCRIPTION OF PROCEDURE:  The nature, purpose, and risks of the procedure  have been reviewed with the patient by Dr. Evette Cristal and myself, and she  provided written consent.  She was brought in a fasted state from her  hospital room to the endoscopy unit and given sedation with Phenergan 25 mg  intravenously, Fentanyl 50 mcg intravenously, and Versed 4 mg intravenously  without arrhythmias or desaturation.  The patient was mildly uncooperative  and restless during the exam but was not frankly combative.  We did have  several pupils helping to hold her down to help make sure that she did not  create a problem as she had on her prior exam when apparently the exam was  difficult for Dr. Madilyn Fireman due to patient cooperation.   The Olympus video endoscope was passed under direct vision.  The vocal cords  were not well seen.  The esophagus was entered without undue difficulty, and  at this time, the esophagus was normal without any evidence of Mallory-Weiss  tear,  reflux esophagitis, Barrett's esophagus, varices, infection, or  neoplasia nor any ring stricture or significant hiatal hernia.   The stomach contained no residual and specifically no blood or coffee-ground  material.  The antrum of the stomach had a moderate amount of edema of the  folds and erythema.  After examining this area for some time, I was able to  identify a roughly 1 cm diameter, moderately-deep ulcer with a small stigma  of hemorrhage but no visible vessel, in between edematous folds.  It was not  readily apparent on initial inspection of the antrum.  It was on the greater  curve aspect of the antrum, probably 5 cm or so proximal to the pylorus.   The pylorus was patent but rather spastic.  The duodenal bulb and proximal  duodenum had a lot of edema of the mucosa and folds and quite a bit of  erythema, but I did not see any erosions or ulcers in the duodenum.  Duodenal biopsies were obtained and also antral biopsies, looking for H.  pylori infection.  Retroflexed  viewing of cardia of the stomach was  unremarkable.   The scope was removed from the patient who tolerated the procedure well and  without apparent complication.   IMPRESSION:  1.  Gastric ulcer with stigma of recent hemorrhage, presumably accounting      for the patient's recent gastrointestinal bleeding but without evidence      of active bleeding (531.00).  2.  Peptic erythema of antrum and duodenum.   DISCUSSION:  The observed appearance could be related to the patient's use  of aspirin and/or inflammation related to alcohol exposure.   RECOMMENDATIONS:  1.  Add sucralfate while the patient is in-house.  I would use 1 gram four      times a day.  2.  Endoscopically, the patient appears to be at low risk for re-bleeding,      so I believe the patient could be discharged as soon as her abdominal      pain symptoms permit.  3.  I would treat her with twice daily PPI therapy for approximately a      month,  after which I would keep her on daily dosing for at least an      additional month or, if she remains on aspirin products, I would keep      her on PPI therapy once daily forever as ulcer prophylaxis.  The least      expensive PPI for this patient is probably Prilosec OTC unless she has a      drug coverage plan.  4.  Await biopsies and treat for H. pylori if they come back positive.  5.  In addition to arranging an early follow-up visit with her primary      physician (to monitor her hemoglobin, her symptoms, and her medication      compliance), I would make a follow-up appointment with Dr. Dorena Cookey      who has seen her previously in the office for GI, for approximately a      month from now, at which time consideration could be given to arranging      a follow-up endoscopy to confirm ulcer healing.  6.  The most important aspect of this patient's care is finding a suitable      alternative to aspirin for control of her knee pain.  7.  The patient indicates that she has had a recent examination of her colon      arranged through her primary physician.  It sounds like this was a      barium enema.  I do not think we need of further lower GI workup at this      time, and I would defer colon cancer screening decisions in the future      to her primary physician.   We appreciate the opportunity to have seen this patient with you.  Please  call us if you have questions or we can be of further assistance.           ______________________________  Bernette Redbird, M.D.     RB/MEDQ  D:  08/29/2005  T:  08/29/2005  Job:  [id number]   cc:   Dr. Leda Roys. Madilyn Fireman, M.D.  Fax: [phone]

## 2010-07-01 NOTE — Op Note (Signed)
NAMEGIUSEPPINA, [name]          ACCOUNT NO.:  [id number]   MEDICAL RECORD NO.:  [id number]          PATIENT TYPE:  INP   LOCATION:  4731                         FACILITY:  [facility]   PHYSICIAN:  John C. Madilyn Fireman, M.D.    DATE OF BIRTH:  [birth year]   DATE OF PROCEDURE:  01/26/2004  DATE OF DISCHARGE:                                 OPERATIVE REPORT   PROCEDURE:  Esophagogastroduodenoscopy with biopsy.   INDICATION FOR PROCEDURE:  Dysphagia, odynophagia, anemia, and heme-positive  stools.   PROCEDURE:  The patient was placed in the left lateral decubitus position  and placed on the pulse monitor with continuous low-flow oxygen delivered by  nasal cannula.  She was sedated with 75 mcg of IV fentanyl and 7.5 mg of IV  Versed.  The Olympus video endoscope was advanced under direct vision into  the oropharynx and esophagus.  The esophagus was straight and of normal  caliber with the squamocolumnar line at 37 cm above a 2 cm hiatal hernia.  There were at least three discrete erosions with exudate or shallow ulcers  near the GE junction consistent with grade 3 erosive esophagitis.  There was  no definite ring or stricture, although I could not rule out some degree of  slight narrowing.  The scope passed through the GE junction easily and a  small amount of liquid secretions was suctioned from the fundus.  Retroflexed view of the cardia confirmed a small hiatal hernia but was  otherwise unremarkable.  The fundus and body appeared normal.  The proximal  antrum appeared normal.  There was some exaggeration of prepyloric folds,  and it was somewhat difficult to view the pylorus directly but it was fairly  easy to advance through the folds into the duodenum.  The duodenal bulb had  a grossly abnormal appearance with cobblestone appearance with large amounts  of heaped-up mucosa that may have represented Brunner gland hyperplasia,  pseudopolyps, or ectopic gastric or pancreatic tissue, and multiple  biopsies  were taken.  The postbulbar duodenum appeared somewhat less abnormal but  there was some flattening of the valvulae.  The scope was withdrawn back  into the stomach and a CLOtest obtained.  The scope was then withdrawn and  the patient returned to the recovery room in stable condition.  She  tolerated the procedure well, and there were no immediate complications.   IMPRESSION:  1.  Moderately severe erosive esophagitis.  2.  Marked duodenitis, see above for differential diagnosis.   PLAN:  1.  Will continue double-dose proton pump inhibitor and try some liquid      Carafate.  2.  Await all biopsy results and CLOtest.       JCH/MEDQ  D:  01/26/2004  T:  01/26/2004  Job:  [id number]   cc:   Ileana Roup, M.D.  [address] [place] [address]  Fax: [phone]

## 2010-07-01 NOTE — Consult Note (Signed)
[name], [name]          ACCOUNT NO.:  [id number]   MEDICAL RECORD NO.:  [id number]          PATIENT TYPE:  INP   LOCATION:  1825                         FACILITY:  [facility]   PHYSICIAN:  Graylin Shiver, M.D.   DATE OF BIRTH:  1945-05-01   DATE OF CONSULTATION:  08/28/2005  DATE OF DISCHARGE:                                   CONSULTATION   REFERRING PHYSICIAN:  Alvester Morin, M.D.   REASON FOR CONSULTATION:  The patient is a 65 year old female admitted via  the emergency room at [facility] with complaints of feeling bad for  several day.  She states that 3 days ago she had a dark, blackish-greenish  stool and yesterday had some coffee-grounds emesis.  She has also been  experiencing epigastric abdominal discomfort for the past several days.  She  does have a history of erosive esophagitis diagnosed by endoscopy in the  past.   PAST MEDICAL HISTORY:  History of erosive esophagitis in the past.   SURGICAL HISTORY:  Hysterectomy.   ALLERGIES:  None known.   MEDICATIONS:  BC powders.   SOCIAL HISTORY:  She drinks alcohol regularly and smokes cigarettes.   REVIEW OF SYSTEMS:  No chest pain, shortness of breath, cough or sputum  production.  She states that she has been getting a little dizzy the last  few days.   LABORATORY DATA:  Hemoglobin originally was 12 and after hydration dropped  to 9.7.   PHYSICAL EXAMINATION:  VITAL SIGNS:  Stable.  She does not appear any acute  distress.  She is sitting comfortably in bed in the emergency room, watching  television.  She is in no distress.  NECK:  Supple.  HEART:  Regular rhythm.  No murmurs.  LUNGS:  Clear.  ABDOMEN:  Bowel sounds are normal.  It is soft.  There is some tenderness in  the epigastrium, but no rebound, guarding, or splenomegaly   IMPRESSION:  1.  Upper gastrointestinal bleed, suspect that this is either secondary to      erosive esophagitis, recurrent peptic ulcer disease or erosive      gastritis.   This is probably all being aggravated by continued use of      alcohol and also BC powder.  2.  History of erosive esophagitis.  3.  History of alcohol abuse.   PLAN:  The patient is being admitted to the hospital to the [facility] Teaching  [facility].  We will follow from a GI standpoint.  The patient will be  transfused as needed.  I would recommend Protonix IV.  We will set her up  for an EGD tomorrow to evaluate the upper GI tract.           ______________________________  Graylin Shiver, M.D.     SFG/MEDQ  D:  08/28/2005  T:  08/29/2005  Job:  [id number]   cc:   Alvester Morin, M.D.  Fax: [phone]   Everardo All. Madilyn Fireman, M.D.  Fax: [phone]

## 2010-07-01 NOTE — Discharge Summary (Signed)
[name], [name]          ACCOUNT NO.:  [id number]   MEDICAL RECORD NO.:  [id number]          PATIENT TYPE:  INP   LOCATION:  4731                         FACILITY:  [facility]   PHYSICIAN:  Ileana Roup, M.D.  DATE OF BIRTH:  1945-11-23   DATE OF ADMISSION:  01/25/2004  DATE OF DISCHARGE:  01/28/2004                                 DISCHARGE SUMMARY   DISCHARGE DIAGNOSES:  1.  Moderately severe erosive esophagitis.  2.  Marked duodenitis.  3.  Hematemesis.  4.  Syncope.  5.  Dysphagia.  6.  History of alcohol abuse.  7.  Anemia.   DISCHARGE MEDICATIONS:  1.  Protonix 40 mg p.o. b.i.d.  2.  Carafate 1 g p.o. before every meal and h.s.  3.  Ferrous gluconate 325 mg p.o. daily.  4.  Colace 100 mg p.o. daily p.r.n. constipation.   FOLLOWUP APPOINTMENTS:  1.  February 04, 2004 at 2:30 p.m. at the [facility] at [facility].  2.  March 14, 2004 with Dr. Madilyn Fireman, gastroenterology.   PROCEDURES PERFORMED DURING ADMISSION:  1.  Esophagogastroduodenoscopy with biopsy.  2.  CT scan of the head on January 26, 2004.  Findings:  Atrophy with no      evidence for acute intracranial abnormality.   CONSULTATIONS DURING THE ADMISSION:  Dr. Everardo All. Madilyn Fireman, gastroenterology.   CONDITION AT DISCHARGE:  Much improved.   BRIEF ADMITTING HISTORY AND PHYSICAL:  [name] is a 65 year old  woman with a past medical history of upper GI bleeding in April of 2005 with  questionable Mallory-Weiss tear, chronic alcohol abuse, who presented on the  morning of January 25, 2004 after a syncopal episode that occurred while  she was on the toilet.  This episode was preceded 3 hours previously by an  episode of frank hematemesis.  She denied any palpitations, loss of  sensation or motor ability with this syncopal episode and there was no  reported seizure-like activity, chest pain or shortness of breath.  She was  found by her boyfriend when he heard a thump and moaning  come from the  bathroom.  She denied striking her head on the floor or wall and she does  note a history of 1 similar episode that occurred in April of 2005 that led  to a diagnosis of esophagitis.  Of note, she also reports approximately 3  months of progressive dysphagia from solids to liquids and decreased p.o.  intake with about a 30-pound weight loss over that time.  She denies any  melena, hematochezia or constipation.   PHYSICAL EXAM:  VITAL SIGNS:  Temperature 97.8, pulse 90, blood pressure  103/74, respirations 20, O2 SATS 97% on room air.  GENERAL:  A cachectic female, but alert and oriented, and in no acute  distress.  HEENT:  Pupils equal, round and reactive to light.  Extraocular movements  intact and benign.  Oropharynx is clear with no exudate.  NECK:  No JVD, no thyromegaly.  Neck is supple with no lymphadenopathy.  RESPIRATORY:  Minimal bibasilar crackles.  CARDIOVASCULAR:  Regular rate and rhythm,  no murmurs, rubs, or gallops.  GI:  Positive bowel sounds.  Non-distended, slight tenderness to percussion  in the left lower quadrant.  RECTAL:  She is fecal occult-blood-positive.  EXTREMITIES:  There is peripheral wasting, but 2+ pulses present bilaterally  and no cords or erythema.  SKIN:  Skin is warm and dry, but with poor turgor.  LYMPHATICS:  There is no lymphadenopathy.  NEUROLOGIC:  Strength is 4/5 throughout. Cranial nerves II-XII are intact  grossly and there is normal sensation and motor abilities with no focal  neurologic signs.   LABORATORIES ON ADMISSION:  Sodium 141, potassium 3.1, chloride 95, BUN 3,  creatinine 1.1, glucose 162.  White blood cell count 7.7, hemoglobin 10.0,  platelets 123,000.  UDS negative.  ETOH level less than 5.  ABG:  PCO2 40.7,  pH of 7.63, bicarb 43.5.   Chest x-ray showed no active lung disease.   HOSPITAL COURSE:  PROBLEM #1 - SYNCOPE:  Given the history of decreased p.o.  intake, hematemesis and a history of GI bleeding, it  was felt this was most  likely secondary to volume depletion.  In order to rule out cardiogenic or  neurogenic causes, she was admitted to telemetry, cardiac enzymes were  cycled and a head CT was performed.  The results of all of these studies  returned normal.  There were no events on telemetry throughout the admission  and she denied any further presyncope or syncopal events.   PROBLEM #2 - DYSPHAGIA:  Given the progressive nature, this was concerning  for stricture versus carcinoma or achalasia.  GI consult was requested.  She  was made n.p.o. and an EGD was performed on hospital day 2 with results  showing moderately severe erosive esophagitis and duodenitis.  A biopsy was  performed and the results of that showed Brunner gland hyperplasia  consistent with duodenitis, but no adenomatous changes or signs of  malignancy.  H. pylori test was negative.  She was started on Protonix  b.i.d. as well as Carafate and will continue these at the time of discharge.  She noted that she was tolerating p.o. intake, solids and liquids, much  better at the time of discharge.   PROBLEM #3 - HISTORY OF GASTROINTESTINAL BLEED AND NEW HEMATEMESIS:  She was  typed and crossed 2 units, and transfused.  CBCs were followed every 8  hours.  She responded well to transfusion.  There were no further episodes  of emesis during her admission.  Results of the endoscopy were mentioned  above.  Iron studies were obtained; ferritin was 51, iron level was 275.  It  was felt that her anemia was likely secondary to occult bleeding as well as  acute hematemesis, therefore she was started on ferrous gluconate to augment  her iron stores.  This will be continued for 1 month.  She will follow up in  the clinic at [facility] in 1 week to recheck a CBC.   PROBLEM #4 - HISTORY OF ALCOHOL ABUSE:  She was on thiamine and folate throughout the admission, and was advised to refrain from consuming alcohol  and seek counseling at  [facility].   PROBLEM #5 - HYPOKALEMIA:  Her potassium was repleted as necessary  throughout the admission.   PROBLEM #6 - METABOLIC ALKALOSIS:  [name] had profound metabolic  alkalosis with a pH of 7.63 on admission, bicarb over 40.  As she was  rehydrated, her bicarb normalized and alkalosis improved.   PROBLEM #7 - CACHEXIA:  In order to address the 30-pound weight loss, Ms.  [name] was advised to use Ensure supplements 2-3 times daily as she can  tolerate.  Her nausea and vomiting were much improved, and p.o. intake was  improving as well.   LABORATORIES AT THE TIME OF DISCHARGE:  TSH 0.806.  B12 591, ferritin 51.  Sodium 136, potassium 4.1, chloride 111, bicarb 22, BUN 6, creatinine 1.0,  glucose 102.  White count 8.3, hemoglobin 9.8, platelets 72,000.  Stool  Giardia negative.  Cryptosporidium negative.  H. pylori negative.       BM/MEDQ  D:  01/28/2004  T:  01/28/2004  Job:  [id number]   cc:   Everardo All. Madilyn Fireman, M.D.  [address]  [place]  [place] [address]  Fax: [phone]

## 2010-11-03 LAB — COMPREHENSIVE METABOLIC PANEL
ALT: 72 — ABNORMAL HIGH
AST: 121 — ABNORMAL HIGH
Albumin: 3.4 — ABNORMAL LOW
CO2: 25
Chloride: 111
Creatinine, Ser: 1.16
GFR calc Af Amer: 57 — ABNORMAL LOW
GFR calc non Af Amer: 47 — ABNORMAL LOW
Potassium: 5.4 — ABNORMAL HIGH
Sodium: 142
Total Bilirubin: 0.4

## 2010-11-03 LAB — URINALYSIS, ROUTINE W REFLEX MICROSCOPIC
Bilirubin Urine: NEGATIVE
Glucose, UA: NEGATIVE
Ketones, ur: NEGATIVE
Nitrite: NEGATIVE
Protein, ur: NEGATIVE
pH: 5.5

## 2010-11-03 LAB — DIFFERENTIAL
Basophils Absolute: 0.2 — ABNORMAL HIGH
Eosinophils Absolute: 0.1
Eosinophils Relative: 2
Lymphocytes Relative: 26
Monocytes Absolute: 0.5

## 2010-11-03 LAB — CBC
MCV: 91.8
Platelets: 161
RBC: 3.47 — ABNORMAL LOW
WBC: 6.2

## 2010-11-03 LAB — POTASSIUM: Potassium: 4.2

## 2010-11-10 LAB — POCT I-STAT, CHEM 8
BUN: 18
Hemoglobin: 12.6
Potassium: 3.9
Sodium: 138
TCO2: 20

## 2010-11-10 LAB — DIFFERENTIAL
Basophils Absolute: 0
Basophils Relative: 1
Eosinophils Absolute: 0.1
Eosinophils Absolute: 0
Eosinophils Relative: 2
Lymphocytes Relative: 38
Lymphocytes Relative: 15
Neutrophils Relative %: 47
Neutrophils Relative %: 59

## 2010-11-10 LAB — CBC
HCT: 33.5 — ABNORMAL LOW
HCT: 37.6
Hemoglobin: 12.4
MCHC: 32.9
MCV: 97.2
Platelets: 101 — ABNORMAL LOW
RDW: 17.9 — ABNORMAL HIGH
RDW: 16.9 — ABNORMAL HIGH

## 2010-11-10 LAB — URINE MICROSCOPIC-ADD ON

## 2010-11-10 LAB — COMPREHENSIVE METABOLIC PANEL
Alkaline Phosphatase: 73
BUN: 11
Calcium: 8.6
Glucose, Bld: 99
Potassium: 3.6
Total Protein: 7

## 2010-11-10 LAB — URINALYSIS, ROUTINE W REFLEX MICROSCOPIC
Leukocytes, UA: NEGATIVE
Nitrite: NEGATIVE
Protein, ur: 100 — AB
Urobilinogen, UA: 0.2

## 2010-11-10 LAB — LIPASE, BLOOD: Lipase: 44

## 2010-11-14 LAB — POCT I-STAT, CHEM 8
BUN: 27 — ABNORMAL HIGH
Chloride: 113 — ABNORMAL HIGH
Creatinine, Ser: 1.4 — ABNORMAL HIGH
Potassium: 4.8
Sodium: 141

## 2010-11-14 LAB — DIFFERENTIAL
Eosinophils Relative: 3
Lymphocytes Relative: 28
Lymphs Abs: 2
Neutro Abs: 4.3
Neutrophils Relative %: 62

## 2010-11-14 LAB — POCT CARDIAC MARKERS
CKMB, poc: 1.8
CKMB, poc: <1 — ABNORMAL LOW
Myoglobin, poc: 64.9
Troponin i, poc: <0.05
Troponin i, poc: <0.05

## 2010-11-14 LAB — CBC
HCT: 29.2 — ABNORMAL LOW
Platelets: 175
WBC: 7

## 2010-11-22 LAB — COMPREHENSIVE METABOLIC PANEL
Albumin: 3.6
BUN: 17
Chloride: 113 — ABNORMAL HIGH
Creatinine, Ser: 0.97
Glucose, Bld: 95
Total Bilirubin: 1
Total Protein: 6.6

## 2010-11-22 LAB — DIFFERENTIAL
Basophils Absolute: 0
Basophils Relative: 0
Lymphocytes Relative: 20
Monocytes Absolute: 0.4
Neutro Abs: 5.4
Neutrophils Relative %: 73

## 2010-11-22 LAB — CBC
HCT: 34.1 — ABNORMAL LOW
Hemoglobin: 11.7 — ABNORMAL LOW
MCV: 93.9
Platelets: 143 — ABNORMAL LOW
RDW: 16.1 — ABNORMAL HIGH

## 2011-05-12 ENCOUNTER — Emergency Department (INDEPENDENT_AMBULATORY_CARE_PROVIDER_SITE_OTHER): Payer: Medicare Other

## 2011-05-12 ENCOUNTER — Emergency Department (INDEPENDENT_AMBULATORY_CARE_PROVIDER_SITE_OTHER)
Admission: EM | Admit: 2011-05-12 | Discharge: 2011-05-12 | Disposition: A | Discharge status: Home / Self Care | Payer: Medicare Other | Source: Home / Self Care | Attending: Emergency Medicine | Admitting: Emergency Medicine

## 2011-05-12 ENCOUNTER — Encounter (HOSPITAL_COMMUNITY): Payer: Self-pay

## 2011-05-12 DIAGNOSIS — M25519 Pain in unspecified shoulder: Secondary | ICD-10-CM

## 2011-05-12 DIAGNOSIS — M25511 Pain in right shoulder: Secondary | ICD-10-CM

## 2011-05-12 MED ORDER — MELOXICAM 7.5 MG PO TABS: 7.5000 mg | ORAL_TABLET | Freq: Every day | ORAL | Status: DC

## 2011-05-12 NOTE — ED Provider Notes (Signed)
History     CSN: [id number]  Arrival date & time 05/12/11  4098   First MD Initiated Contact with Patient 05/12/11 0845      Chief Complaint  Patient presents with  . Shoulder Pain    (Consider location/radiation/quality/duration/timing/severity/associated sxs/prior treatment) HPI Comments: Been having R shoulder pain for abut 3 months its been getting progressively worse" During this last week i cant even raise my arm up; No numbness or weakness of fingers. No recent injury, falls or trauma  Patient is a 66 y.o. female presenting with shoulder pain. The history is provided by the patient.  Shoulder Pain This is a new problem. The problem occurs constantly. The problem has not changed since onset.Pertinent negatives include no chest pain and no shortness of breath. Exacerbated by: lifting R arm. The symptoms are relieved by nothing. She has tried nothing for the symptoms. The treatment provided no relief.    History reviewed. No pertinent past medical history.  Past Surgical History  Procedure Date  . Cholecystectomy     History reviewed. No pertinent family history.  History  Substance Use Topics  . Smoking status: Current Everyday Smoker -- 0.5 packs/day for 50 years    Types: Cigarettes  . Smokeless tobacco: Not on file  . Alcohol Use: Yes    OB History    Grav Para Term Preterm Abortions TAB SAB Ect Mult Living                  Review of Systems  Constitutional: Negative for fever, appetite change and fatigue.  Respiratory: Negative for chest tightness and shortness of breath.   Cardiovascular: Negative for chest pain.  Musculoskeletal:       R shoulder pain posterior and lateral  Skin: Negative for color change, pallor, rash and wound.    Allergies  Percocet and Vicodin  Home Medications   Current Outpatient Rx  Name Route Sig Dispense Refill  . MELOXICAM 7.5 MG PO TABS Oral Take 1 tablet (7.5 mg total) by mouth daily. 14 tablet 0    BP 145/92   Pulse 99  Temp(Src) 98 F (36.7 C) (Oral)  Resp 12  Ht 5\' 5"  (1.651 m)  Wt 100 lb (45.36 kg)  BMI 16.64 kg/m2  SpO2 99%  Physical Exam  Nursing note and vitals reviewed. Constitutional: She appears well-developed and well-nourished.  HENT:  Head: Normocephalic.  Eyes: Conjunctivae are normal.  Neck: Neck supple. No JVD present.  Musculoskeletal: She exhibits tenderness. She exhibits no edema.       Right shoulder: She exhibits decreased range of motion, tenderness, bony tenderness and pain. She exhibits no swelling, no effusion, no crepitus, no deformity and normal strength.  Neurological: She is alert. No cranial nerve deficit. She exhibits normal muscle tone.  Skin: Skin is warm and intact. No abrasion and no rash noted. She is not diaphoretic. No erythema.       ED Course  Procedures (including critical care time)  Labs Reviewed - No data to display Dg Shoulder Right  05/12/2011  *RADIOLOGY REPORT*  Clinical Data: Shoulder pain  RIGHT SHOULDER - 2+ VIEW  Comparison: Chest x-ray 11/02/2007  Findings: Three views of the right shoulder submitted.  No acute fracture or subluxation.  Stable calcified granuloma or pleural calcification in the right apex.  IMPRESSION: No acute fracture or subluxation.  Original Report Authenticated By: Natasha Mead, M.D.     1. Chronic right shoulder pain       MDM  Shoulder pain x 3 months, patient with marked restriction of ROM of R shoulder. No recent injuries, traumas, or falls. Recommended follow-up with orthopedic doctor, considering rotator cuff injury or partial tear. No neurovascular deficits        Jimmie Molly, MD 05/12/11 2052

## 2011-05-12 NOTE — ED Notes (Signed)
Pt c/o of shoulder pain. Denies any recent injury or arthritis. Rates pain 8/10 that radiates to neck. Throbbing.

## 2011-05-12 NOTE — Discharge Instructions (Signed)
Discussed her x-ray results and your ongoing shoulder pain. Have recommended you see the orthopedic Dr. as your exam is limited as you are unable to perform some of the maneuvers that could provide more information.

## 2012-02-15 ENCOUNTER — Encounter (HOSPITAL_COMMUNITY): Payer: Self-pay | Admitting: Emergency Medicine

## 2012-02-15 ENCOUNTER — Emergency Department (INDEPENDENT_AMBULATORY_CARE_PROVIDER_SITE_OTHER): Payer: Medicare Other

## 2012-02-15 ENCOUNTER — Emergency Department (INDEPENDENT_AMBULATORY_CARE_PROVIDER_SITE_OTHER)
Admission: EM | Admit: 2012-02-15 | Discharge: 2012-02-15 | Disposition: A | Discharge status: Home / Self Care | Payer: Medicare Other | Source: Home / Self Care | Attending: Family Medicine | Admitting: Family Medicine

## 2012-02-15 DIAGNOSIS — S63279A Dislocation of unspecified interphalangeal joint of unspecified finger, initial encounter: Secondary | ICD-10-CM

## 2012-02-15 DIAGNOSIS — I10 Essential (primary) hypertension: Secondary | ICD-10-CM

## 2012-02-15 LAB — POCT I-STAT, CHEM 8
BUN: 31 mg/dL — ABNORMAL HIGH (ref 6–23)
Calcium, Ion: 1.01 mmol/L — ABNORMAL LOW (ref 1.13–1.30)
Chloride: 110 meq/L (ref 96–112)
Creatinine, Ser: 1.9 mg/dL — ABNORMAL HIGH (ref 0.50–1.10)
Glucose, Bld: 85 mg/dL (ref 70–99)
HCT: 35 % — ABNORMAL LOW (ref 36.0–46.0)
Hemoglobin: 11.9 g/dL — ABNORMAL LOW (ref 12.0–15.0)
Potassium: 4.1 meq/L (ref 3.5–5.1)
Sodium: 141 meq/L (ref 135–145)
TCO2: 23 mmol/L (ref 0–100)

## 2012-02-15 MED ORDER — ONDANSETRON 4 MG PO TBDP: 4.0000 mg | ORAL_TABLET | Freq: Three times a day (TID) | ORAL | Status: DC | PRN

## 2012-02-15 MED ORDER — CLONIDINE HCL 0.1 MG PO TABS
0.1000 mg | ORAL_TABLET | Freq: Once | ORAL | Status: AC
  Administered 2012-02-15: 0.1 mg via ORAL

## 2012-02-15 MED ORDER — HYDROCHLOROTHIAZIDE 12.5 MG PO TABS: 12.5000 mg | ORAL_TABLET | Freq: Every day | ORAL | Status: DC

## 2012-02-15 MED ORDER — TRIAMTERENE-HCTZ 37.5-25 MG PO TABS: ORAL_TABLET | ORAL | Status: DC

## 2012-02-15 MED ORDER — ACETAMINOPHEN 325 MG PO TABS
ORAL_TABLET | ORAL | Status: AC
  Filled 2012-02-15: qty 2

## 2012-02-15 MED ORDER — ACETAMINOPHEN 325 MG PO TABS
650.0000 mg | ORAL_TABLET | Freq: Once | ORAL | Status: AC
  Administered 2012-02-15: 650 mg via ORAL

## 2012-02-15 MED ORDER — NAPROXEN SODIUM 220 MG PO TABS: 220.0000 mg | ORAL_TABLET | Freq: Two times a day (BID) | ORAL | Status: DC

## 2012-02-15 MED ORDER — ACETAMINOPHEN-CODEINE #3 300-30 MG PO TABS: 1.0000 | ORAL_TABLET | Freq: Four times a day (QID) | ORAL | Status: DC | PRN

## 2012-02-15 MED ORDER — CLONIDINE HCL 0.1 MG PO TABS
ORAL_TABLET | ORAL | Status: AC
  Filled 2012-02-15: qty 1

## 2012-02-15 NOTE — ED Notes (Signed)
Pt c/o left middle finger inj since yest States she slipped in the kitchen and onto tile flooring.  Sx include: swelling and pain Denies: SOB, headaches, blurry vision, chest pain, edema She is alert and responsive w/no signs of acute distress.

## 2012-02-15 NOTE — ED Provider Notes (Signed)
History     CSN: [id number]  Arrival date & time 02/15/12  1048   First MD Initiated Contact with Patient 02/15/12 1133      Chief Complaint  Patient presents with  . Finger Injury    (Consider location/radiation/quality/duration/timing/severity/associated sxs/prior treatment) HPI Comments: 67 year old smoker female here complaining of left middle finger pain and deformity after falling in her kitchen just after midnight on New Year's Eve. Patient reports she was walking in her kitchen and tripped and fell landing on her left hand first. Denies injuring her head or any other body areas. No prior HTN diagnosis. Does not take any medications for HTN. Denies chest pain, SOB, leg swelling or PND. Patient has smoked 1 ppd for 50 years.   History reviewed. No pertinent past medical history.  Past Surgical History  Procedure Date  . Cholecystectomy     No family history on file.  History  Substance Use Topics  . Smoking status: Current Every Day Smoker -- 1.0 packs/day for 50 years    Types: Cigarettes  . Smokeless tobacco: Not on file  . Alcohol Use: Yes    OB History    Grav Para Term Preterm Abortions TAB SAB Ect Mult Living                  Review of Systems  Constitutional: Negative for chills, diaphoresis and appetite change.  Respiratory: Negative for chest tightness and shortness of breath.   Cardiovascular: Negative for chest pain and leg swelling.  Gastrointestinal: Negative for nausea, vomiting, abdominal pain and diarrhea.  Musculoskeletal:       Left hand: middle finger injury as per HPI  Neurological: Negative for dizziness, weakness, numbness and headaches.  All other systems reviewed and are negative.    Allergies  Percocet and Vicodin  Home Medications   Current Outpatient Rx  Name  Route  Sig  Dispense  Refill  . ACETAMINOPHEN-CODEINE #3 300-30 MG PO TABS   Oral   Take 1 tablet by mouth every 6 (six) hours as needed for pain.   20 tablet   0   . HYDROCHLOROTHIAZIDE 12.5 MG PO TABS   Oral   Take 1 tablet (12.5 mg total) by mouth daily.   30 tablet   1   . ONDANSETRON 4 MG PO TBDP   Oral   Take 1 tablet (4 mg total) by mouth every 8 (eight) hours as needed for nausea.   10 tablet   0     BP 170/90  Pulse 102  Temp 98.4 F (36.9 C) (Oral)  Resp 18  SpO2 100%  Physical Exam  Nursing note and vitals reviewed. Constitutional: She is oriented to person, place, and time. She appears well-developed and well-nourished. No distress.  HENT:  Head: Normocephalic and atraumatic.  Eyes: Conjunctivae normal and EOM are normal. Pupils are equal, round, and reactive to light.  Neck: No JVD present. No thyromegaly present.  Cardiovascular: Regular rhythm and normal heart sounds.        No LEE  Pulmonary/Chest: Breath sounds normal. No respiratory distress. She has no wheezes. She has no rales.  Musculoskeletal:       Left hand: tenderness swelling and deformity of left middle finger at level of PIPJ. No other deformities. Range of motion is also affected in left middle finger. Sensation appears intact. Mild discomfort with wristst flexion and extension but no obvious swelling or deformity of the wrist. Left forearm with no focal tendenrness, swelling or deformities.  Rest of left hand and wrist exam is normal.    Neurological: She is alert and oriented to person, place, and time.  Skin: She is not diaphoretic.       No skin wouds    ED Course  Reduction of dislocation Performed by: Sharin Grave Authorized by: Sharin Grave Consent: Verbal consent obtained. Risks and benefits: risks, benefits and alternatives were discussed Consent given by: patient Patient understanding: patient states understanding of the procedure being performed Patient consent: the patient's understanding of the procedure matches consent given Preparation: Patient was prepped and draped in the usual sterile fashion. Local anesthesia used:  yes Anesthesia: local infiltration and digital block Local anesthetic: lidocaine 1% without epinephrine Anesthetic total: 2 ml Patient tolerance: Patient tolerated the procedure well with no immediate complications. Comments: After digital block performed. Hand traction was applied to left middle finger PIPJ luxation was manually reduced. Patient was able to flex and extend actively and passively the affected PIPJ after procedure with no discomfort. Deformity resolved. Good perfusion off entire right hand including left middle finger after procedure. Patient was taken to post reduction Xrays, that showed normal alignment.    (including critical care time)  Labs Reviewed  POCT I-STAT, CHEM 8 - Abnormal; Notable for the following:    BUN 31 (*)     Creatinine, Ser 1.90 (*)     Calcium, Ion 1.01 (*)     Hemoglobin 11.9 (*)     HCT 35.0 (*)     All other components within normal limits  LAB REPORT - SCANNED   Dg Hand Complete Left  02/15/2012  *RADIOLOGY REPORT*  Clinical Data: , finger injury  LEFT HAND - COMPLETE 3+ VIEW  Comparison: None.  Findings: Acute posterior dislocation of the proximal interphalangeal joint of the middle finger.  The middle phalanx is dislocated dorsally with respect to the proximal phalanx.  No definite acute fracture is identified.  The bones appear mildly osteopenic.  Chondrocalcinosis noted in the rest throughout the radiocarpal and ulnar carpal joints.  IMPRESSION:  1.  Acute dislocation of the proximal interphalangeal joint of the middle finger.  The middle phalanx is dislocated posteriorly with respect to the proximal phalanx. 2.  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   Original Report Authenticated By: Malachy Moan, M.D.    Dg Finger Middle Left  02/15/2012  *RADIOLOGY REPORT*  Clinical Data: Dislocation.  LEFT MIDDLE FINGER 2+V  Comparison: Film earlier in  the day.  Findings:  The previously noted third finger PIP dislocation has been reduced.  There is no visible fracture.  IMPRESSION: As above.   Original Report Authenticated By: Davonna Belling, M.D.      1. Dislocation of finger, interphalangeal joint, left, closed   2. Hypertension       MDM  67 year old female smoker here with a dislocated left middle finger. Status post reduction today. Patient was placed on a finger splint and a wrist splint. Rehabilitation exercises as discussed with patient. Patient was asked to followup with a hand specialist in 3-7 days to make sure there is no ligament injury associated. Also patient was significantly hypertensive with a systolic blood pressure in the 210 and diastolic 107 checked manually. She was given a dose of clonidine 0.1 and her blood pressure decreased to 170/90. Her i-STAT showed a creatinine of 1.9 patient has had a creatinine between 1.4 and 1.7 in the past. My impression is that  this patient has had long-term untreated high blood pressure. I started patient on HCTZ 25 mg half a tablet daily. Arrangements were done for an appointment at the adult clinic for a blood pressure and labs follow up up next week. A future order will be placed for a complete metabolic panel, TSH, fasting lipid panel prior her next visit.  Note: after i-stat result obtained patient's pharmacy was called and a prescription for Maxzide was changed to hydrochlorothiazide and naproxen prescription was discontinued.  Sharin Grave, MD 02/17/12 [phone]

## 2013-12-14 DIAGNOSIS — K7460 Unspecified cirrhosis of liver: Secondary | ICD-10-CM

## 2013-12-14 HISTORY — DX: Unspecified cirrhosis of liver: K74.60

## 2013-12-27 ENCOUNTER — Encounter (HOSPITAL_COMMUNITY): Payer: Self-pay | Admitting: Cardiology

## 2013-12-27 ENCOUNTER — Emergency Department (HOSPITAL_COMMUNITY): Payer: Medicare Other

## 2013-12-27 ENCOUNTER — Inpatient Hospital Stay (HOSPITAL_COMMUNITY)
Admission: EM | Admit: 2013-12-27 | Discharge: 2014-01-01 | DRG: 392 | Disposition: A | Discharge status: Home / Self Care | Payer: Medicare Other | Attending: Internal Medicine | Admitting: Internal Medicine

## 2013-12-27 DIAGNOSIS — R188 Other ascites: Secondary | ICD-10-CM | POA: Diagnosis present

## 2013-12-27 DIAGNOSIS — R160 Hepatomegaly, not elsewhere classified: Secondary | ICD-10-CM | POA: Insufficient documentation

## 2013-12-27 DIAGNOSIS — R109 Unspecified abdominal pain: Secondary | ICD-10-CM | POA: Diagnosis present

## 2013-12-27 DIAGNOSIS — K7469 Other cirrhosis of liver: Secondary | ICD-10-CM | POA: Diagnosis present

## 2013-12-27 DIAGNOSIS — D649 Anemia, unspecified: Secondary | ICD-10-CM | POA: Diagnosis present

## 2013-12-27 DIAGNOSIS — J449 Chronic obstructive pulmonary disease, unspecified: Secondary | ICD-10-CM | POA: Diagnosis present

## 2013-12-27 DIAGNOSIS — N183 Chronic kidney disease, stage 3 (moderate): Secondary | ICD-10-CM | POA: Diagnosis present

## 2013-12-27 DIAGNOSIS — K7689 Other specified diseases of liver: Secondary | ICD-10-CM | POA: Diagnosis present

## 2013-12-27 DIAGNOSIS — R64 Cachexia: Secondary | ICD-10-CM | POA: Diagnosis present

## 2013-12-27 DIAGNOSIS — D7589 Other specified diseases of blood and blood-forming organs: Secondary | ICD-10-CM | POA: Diagnosis present

## 2013-12-27 DIAGNOSIS — E872 Acidosis: Secondary | ICD-10-CM | POA: Diagnosis present

## 2013-12-27 DIAGNOSIS — D638 Anemia in other chronic diseases classified elsewhere: Secondary | ICD-10-CM | POA: Diagnosis present

## 2013-12-27 DIAGNOSIS — D72829 Elevated white blood cell count, unspecified: Secondary | ICD-10-CM | POA: Diagnosis present

## 2013-12-27 DIAGNOSIS — E861 Hypovolemia: Secondary | ICD-10-CM | POA: Diagnosis present

## 2013-12-27 DIAGNOSIS — N189 Chronic kidney disease, unspecified: Secondary | ICD-10-CM

## 2013-12-27 DIAGNOSIS — E46 Unspecified protein-calorie malnutrition: Secondary | ICD-10-CM | POA: Diagnosis present

## 2013-12-27 DIAGNOSIS — Z681 Body mass index (BMI) 19 or less, adult: Secondary | ICD-10-CM

## 2013-12-27 DIAGNOSIS — K2970 Gastritis, unspecified, without bleeding: Secondary | ICD-10-CM | POA: Diagnosis present

## 2013-12-27 DIAGNOSIS — N179 Acute kidney failure, unspecified: Secondary | ICD-10-CM | POA: Diagnosis present

## 2013-12-27 DIAGNOSIS — F1010 Alcohol abuse, uncomplicated: Secondary | ICD-10-CM | POA: Diagnosis present

## 2013-12-27 DIAGNOSIS — F17210 Nicotine dependence, cigarettes, uncomplicated: Secondary | ICD-10-CM | POA: Diagnosis present

## 2013-12-27 DIAGNOSIS — R740 Nonspecific elevation of levels of transaminase and lactic acid dehydrogenase [LDH]: Secondary | ICD-10-CM

## 2013-12-27 DIAGNOSIS — E162 Hypoglycemia, unspecified: Secondary | ICD-10-CM | POA: Diagnosis present

## 2013-12-27 DIAGNOSIS — R1901 Right upper quadrant abdominal swelling, mass and lump: Principal | ICD-10-CM | POA: Diagnosis present

## 2013-12-27 DIAGNOSIS — R7401 Elevation of levels of liver transaminase levels: Secondary | ICD-10-CM

## 2013-12-27 DIAGNOSIS — R630 Anorexia: Secondary | ICD-10-CM | POA: Diagnosis present

## 2013-12-27 DIAGNOSIS — D696 Thrombocytopenia, unspecified: Secondary | ICD-10-CM | POA: Diagnosis present

## 2013-12-27 DIAGNOSIS — R1084 Generalized abdominal pain: Secondary | ICD-10-CM

## 2013-12-27 DIAGNOSIS — K831 Obstruction of bile duct: Secondary | ICD-10-CM | POA: Diagnosis present

## 2013-12-27 HISTORY — DX: Unspecified cirrhosis of liver: K74.60

## 2013-12-27 LAB — CBC WITH DIFFERENTIAL/PLATELET
BASOS ABS: 0.2 10*3/uL — AB (ref 0.0–0.1)
Basophils Absolute: 0 10*3/uL (ref 0.0–0.1)
Basophils Relative: 1 % (ref 0–1)
Basophils Relative: 0 % (ref 0–1)
EOS PCT: 1 % (ref 0–5)
Eosinophils Absolute: 0.2 10*3/uL (ref 0.0–0.7)
Eosinophils Absolute: 0.2 10*3/uL (ref 0.0–0.7)
Eosinophils Relative: 1 % (ref 0–5)
HCT: 23.2 % — ABNORMAL LOW (ref 36.0–46.0)
HEMATOCRIT: 23.8 % — AB (ref 36.0–46.0)
HEMOGLOBIN: 7.2 g/dL — AB (ref 12.0–15.0)
Hemoglobin: 7.2 g/dL — ABNORMAL LOW (ref 12.0–15.0)
LYMPHS ABS: 1 10*3/uL (ref 0.7–4.0)
LYMPHS ABS: 1.6 10*3/uL (ref 0.7–4.0)
LYMPHS PCT: 6 % — AB (ref 12–46)
Lymphocytes Relative: 10 % — ABNORMAL LOW (ref 12–46)
MCH: 30.3 pg (ref 26.0–34.0)
MCH: 30.1 pg (ref 26.0–34.0)
MCHC: 30.3 g/dL (ref 30.0–36.0)
MCHC: 31 g/dL (ref 30.0–36.0)
MCV: 100 fL (ref 78.0–100.0)
MCV: 97.1 fL (ref 78.0–100.0)
MONOS PCT: 9 % (ref 3–12)
Monocytes Absolute: 1.5 10*3/uL — ABNORMAL HIGH (ref 0.1–1.0)
Monocytes Absolute: 1 10*3/uL (ref 0.1–1.0)
Monocytes Relative: 6 % (ref 3–12)
NEUTROS ABS: 13.6 10*3/uL — AB (ref 1.7–7.7)
Neutro Abs: 13.5 10*3/uL — ABNORMAL HIGH (ref 1.7–7.7)
Neutrophils Relative %: 83 % — ABNORMAL HIGH (ref 43–77)
Neutrophils Relative %: 83 % — ABNORMAL HIGH (ref 43–77)
PLATELETS: 73 10*3/uL — AB (ref 150–400)
Platelets: 67 10*3/uL — ABNORMAL LOW (ref 150–400)
RBC: 2.38 MIL/uL — AB (ref 3.87–5.11)
RBC: 2.39 MIL/uL — AB (ref 3.87–5.11)
RDW: 23.4 % — AB (ref 11.5–15.5)
RDW: 23.2 % — ABNORMAL HIGH (ref 11.5–15.5)
WBC: 16.5 10*3/uL — AB (ref 4.0–10.5)
WBC: 16.3 10*3/uL — ABNORMAL HIGH (ref 4.0–10.5)

## 2013-12-27 LAB — PROTIME-INR
INR: 1.4 (ref 0.00–1.49)
PROTHROMBIN TIME: 17.3 s — AB (ref 11.6–15.2)

## 2013-12-27 LAB — COMPREHENSIVE METABOLIC PANEL
ALBUMIN: 1.6 g/dL — AB (ref 3.5–5.2)
ALK PHOS: 964 U/L — AB (ref 39–117)
ALT: 33 U/L (ref 0–35)
AST: 111 U/L — AB (ref 0–37)
Anion gap: 22 — ABNORMAL HIGH (ref 5–15)
BILIRUBIN TOTAL: 3.1 mg/dL — AB (ref 0.3–1.2)
BUN: 55 mg/dL — AB (ref 6–23)
CHLORIDE: 95 meq/L — AB (ref 96–112)
CO2: 17 meq/L — AB (ref 19–32)
CREATININE: 2.52 mg/dL — AB (ref 0.50–1.10)
Calcium: 8.9 mg/dL (ref 8.4–10.5)
GFR calc Af Amer: 21 mL/min — ABNORMAL LOW (ref >90)
GFR, EST NON AFRICAN AMERICAN: 18 mL/min — AB (ref >90)
Glucose, Bld: 147 mg/dL — ABNORMAL HIGH (ref 70–99)
POTASSIUM: 5.2 meq/L (ref 3.7–5.3)
Sodium: 134 mEq/L — ABNORMAL LOW (ref 137–147)
Total Protein: 6 g/dL (ref 6.0–8.3)

## 2013-12-27 LAB — LIPASE, BLOOD: Lipase: 57 U/L (ref 11–59)

## 2013-12-27 LAB — BILIRUBIN, DIRECT: Bilirubin, Direct: 3 mg/dL — ABNORMAL HIGH (ref 0.0–0.3)

## 2013-12-27 LAB — SALICYLATE LEVEL: Salicylate Lvl: <2 mg/dL — ABNORMAL LOW (ref 2.8–20.0)

## 2013-12-27 LAB — GAMMA GT: GGT: 653 U/L — AB (ref 7–51)

## 2013-12-27 LAB — ETHANOL: Alcohol, Ethyl (B): <11 mg/dL (ref 0–11)

## 2013-12-27 LAB — RETICULOCYTES
RBC.: 2.39 MIL/uL — ABNORMAL LOW (ref 3.87–5.11)
Retic Count, Absolute: 167.3 10*3/uL (ref 19.0–186.0)
Retic Ct Pct: 7 % — ABNORMAL HIGH (ref 0.4–3.1)

## 2013-12-27 LAB — TSH: TSH: 4.09 u[IU]/mL (ref 0.350–4.500)

## 2013-12-27 LAB — AMMONIA: Ammonia: 80 umol/L — ABNORMAL HIGH (ref 11–60)

## 2013-12-27 LAB — APTT: aPTT: 45 seconds — ABNORMAL HIGH (ref 24–37)

## 2013-12-27 LAB — ACETAMINOPHEN LEVEL

## 2013-12-27 MED ORDER — ONDANSETRON HCL 4 MG/2ML IJ SOLN
4.0000 mg | Freq: Once | INTRAMUSCULAR | Status: AC
  Administered 2013-12-27: 4 mg via INTRAVENOUS
  Filled 2013-12-27: qty 2

## 2013-12-27 MED ORDER — PANTOPRAZOLE SODIUM 40 MG IV SOLR
40.0000 mg | INTRAVENOUS | Status: DC
  Filled 2013-12-27: qty 40

## 2013-12-27 MED ORDER — HYDROMORPHONE HCL 1 MG/ML IJ SOLN: 0.5000 mg | Freq: Once | INTRAMUSCULAR | Status: DC

## 2013-12-27 MED ORDER — ONDANSETRON HCL 4 MG PO TABS
4.0000 mg | ORAL_TABLET | Freq: Four times a day (QID) | ORAL | Status: DC | PRN
  Administered 2013-12-28: 4 mg via ORAL
  Filled 2013-12-27: qty 1

## 2013-12-27 MED ORDER — ONDANSETRON HCL 4 MG/2ML IJ SOLN
4.0000 mg | Freq: Four times a day (QID) | INTRAMUSCULAR | Status: DC | PRN
  Administered 2013-12-31: 4 mg via INTRAVENOUS
  Filled 2013-12-27: qty 2

## 2013-12-27 MED ORDER — ONDANSETRON HCL 4 MG/2ML IJ SOLN: 4.0000 mg | Freq: Once | INTRAMUSCULAR | Status: DC

## 2013-12-27 MED ORDER — LACTULOSE 10 GM/15ML PO SOLN
20.0000 g | Freq: Two times a day (BID) | ORAL | Status: DC
  Administered 2013-12-28 – 2013-12-31 (×6): 20 g via ORAL
  Filled 2013-12-27 (×11): qty 30

## 2013-12-27 MED ORDER — FENTANYL CITRATE 0.05 MG/ML IJ SOLN
50.0000 ug | Freq: Once | INTRAMUSCULAR | Status: AC
  Administered 2013-12-27: 50 ug via INTRAVENOUS
  Filled 2013-12-27: qty 2

## 2013-12-27 MED ORDER — SODIUM CHLORIDE 0.9 % IV BOLUS (SEPSIS)
250.0000 mL | Freq: Once | INTRAVENOUS | Status: AC
  Administered 2013-12-27: 250 mL via INTRAVENOUS

## 2013-12-27 MED ORDER — OXYCODONE HCL 5 MG PO TABS
5.0000 mg | ORAL_TABLET | ORAL | Status: DC | PRN
  Administered 2013-12-28 – 2013-12-30 (×3): 5 mg via ORAL
  Filled 2013-12-27 (×3): qty 1

## 2013-12-27 MED ORDER — SODIUM CHLORIDE 0.9 % IV SOLN
INTRAVENOUS | Status: DC
  Administered 2013-12-27 – 2013-12-28 (×2): via INTRAVENOUS

## 2013-12-27 MED ORDER — CEFTRIAXONE SODIUM IN DEXTROSE 20 MG/ML IV SOLN
1.0000 g | INTRAVENOUS | Status: DC
  Administered 2013-12-28 (×2): 1 g via INTRAVENOUS
  Filled 2013-12-27 (×3): qty 50

## 2013-12-27 MED ORDER — IOHEXOL 300 MG/ML  SOLN
25.0000 mL | INTRAMUSCULAR | Status: AC
  Administered 2013-12-27: 25 mL via ORAL

## 2013-12-27 NOTE — ED Provider Notes (Signed)
CSN: [id number]     Arrival date & time 12/27/13  1227 History   First MD Initiated Contact with Patient 12/27/13 1354     Chief Complaint  Patient presents with  . Abdominal Pain  . Shortness of Breath     (Consider location/radiation/quality/duration/timing/severity/associated sxs/prior Treatment) Patient is a 68 y.o. female presenting with abdominal pain and shortness of breath. The history is provided by the patient and a relative.  Abdominal Pain Associated symptoms: nausea and vomiting   Associated symptoms: no chest pain, no diarrhea, no dysuria, no fever and no shortness of breath   Shortness of Breath Associated symptoms: abdominal pain and vomiting   Associated symptoms: no chest pain, no fever and no rash   patient with complaint of abdominal pain mostly lower quadrants. Fullness and distention of the abdomen. Nausea and vomiting. Discomfort is been present for about a week and a half. Patient's had decreased appetite. Also eyes a been turning yellow. No blood in the vomit. Only vomited once today. No diarrhea. No fevers. No primary care doctor.patient's had her gallbladder removed in the past.  History reviewed. No pertinent past medical history. Past Surgical History  Procedure Laterality Date  . Cholecystectomy     History reviewed. No pertinent family history. History  Substance Use Topics  . Smoking status: Current Every Day Smoker -- 1.00 packs/day for 50 years    Types: Cigarettes  . Smokeless tobacco: Not on file  . Alcohol Use: Yes   OB History    No data available     Review of Systems  Constitutional: Positive for appetite change. Negative for fever.  HENT: Negative for congestion.   Eyes: Negative for redness.  Respiratory: Negative for shortness of breath.   Cardiovascular: Positive for leg swelling. Negative for chest pain.  Gastrointestinal: Positive for nausea, vomiting, abdominal pain and abdominal distention. Negative for diarrhea.   Genitourinary: Negative for dysuria.  Musculoskeletal: Negative for back pain.  Skin: Positive for color change. Negative for rash.  Hematological: Does not bruise/bleed easily.  Psychiatric/Behavioral: Negative for confusion.      Allergies  Percocet and Vicodin  Home Medications   Prior to Admission medications   Medication Sig Start Date End Date Taking? Authorizing Provider  acetaminophen-codeine (TYLENOL #3) 300-30 MG per tablet Take 1 tablet by mouth every 6 (six) hours as needed for pain. 02/15/12   Adlih Moreno-Coll, MD  hydrochlorothiazide (HYDRODIURIL) 12.5 MG tablet Take 1 tablet (12.5 mg total) by mouth daily. 02/15/12   Adlih Moreno-Coll, MD  ondansetron (ZOFRAN-ODT) 4 MG disintegrating tablet Take 1 tablet (4 mg total) by mouth every 8 (eight) hours as needed for nausea. 02/15/12   Adlih Moreno-Coll, MD   BP 120/74 mmHg  Pulse 91  Temp(Src) 98.1 F (36.7 C) (Oral)  Resp 20  Ht 5\' 5"  (1.651 m)  Wt 100 lb (45.36 kg)  BMI 16.64 kg/m2  SpO2 100% Physical Exam  Constitutional: She is oriented to person, place, and time. She appears well-developed and well-nourished. No distress.  HENT:  Head: Normocephalic and atraumatic.  Mouth/Throat: Oropharynx is clear and moist.  Eyes: Conjunctivae and EOM are normal. Pupils are equal, round, and reactive to light. Scleral icterus is present.  Neck: Normal range of motion. Neck supple.  Cardiovascular: Normal rate, regular rhythm and normal heart sounds.   No murmur heard. Pulmonary/Chest: Effort normal and breath sounds normal. No respiratory distress.  Abdominal: Soft. Bowel sounds are normal. She exhibits mass. She exhibits no distension. There is no  tenderness. There is no rebound.  Musculoskeletal: Normal range of motion. She exhibits edema.  Neurological: She is alert and oriented to person, place, and time. No cranial nerve deficit. She exhibits normal muscle tone. Coordination normal.  Skin: Skin is warm. No rash noted.   Nursing note and vitals reviewed.   ED Course  Procedures (including critical care time) Labs Review Labs Reviewed  CBC WITH DIFFERENTIAL - Abnormal; Notable for the following:    WBC 16.5 (*)    RBC 2.38 (*)    Hemoglobin 7.2 (*)    HCT 23.8 (*)    RDW 23.4 (*)    Platelets 67 (*)    Neutrophils Relative % 83 (*)    Lymphocytes Relative 6 (*)    Neutro Abs 13.6 (*)    Monocytes Absolute 1.5 (*)    Basophils Absolute 0.2 (*)    All other components within normal limits  COMPREHENSIVE METABOLIC PANEL - Abnormal; Notable for the following:    Sodium 134 (*)    Chloride 95 (*)    CO2 17 (*)    Glucose, Bld 147 (*)    BUN 55 (*)    Creatinine, Ser 2.52 (*)    Albumin 1.6 (*)    AST 111 (*)    Alkaline Phosphatase 964 (*)    Total Bilirubin 3.1 (*)    GFR calc non Af Amer 18 (*)    GFR calc Af Amer 21 (*)    Anion gap 22 (*)    All other components within normal limits  LIPASE, BLOOD  URINALYSIS, ROUTINE W REFLEX MICROSCOPIC  TYPE AND SCREEN   Results for orders placed or performed during the hospital encounter of 12/27/13  CBC WITH DIFFERENTIAL  Result Value Ref Range   WBC 16.5 (H) 4.0 - 10.5 K/uL   RBC 2.38 (L) 3.87 - 5.11 MIL/uL   Hemoglobin 7.2 (L) 12.0 - 15.0 g/dL   HCT 23.8 (L) 36.0 - 46.0 %   MCV 100.0 78.0 - 100.0 fL   MCH 30.3 26.0 - 34.0 pg   MCHC 30.3 30.0 - 36.0 g/dL   RDW 23.4 (H) 11.5 - 15.5 %   Platelets 67 (L) 150 - 400 K/uL   Neutrophils Relative % 83 (H) 43 - 77 %   Lymphocytes Relative 6 (L) 12 - 46 %   Monocytes Relative 9 3 - 12 %   Eosinophils Relative 1 0 - 5 %   Basophils Relative 1 0 - 1 %   Neutro Abs 13.6 (H) 1.7 - 7.7 K/uL   Lymphs Abs 1.0 0.7 - 4.0 K/uL   Monocytes Absolute 1.5 (H) 0.1 - 1.0 K/uL   Eosinophils Absolute 0.2 0.0 - 0.7 K/uL   Basophils Absolute 0.2 (H) 0.0 - 0.1 K/uL   RBC Morphology POLYCHROMASIA PRESENT   Comprehensive metabolic panel  Result Value Ref Range   Sodium 134 (L) 137 - 147 mEq/L   Potassium 5.2 3.7  - 5.3 mEq/L   Chloride 95 (L) 96 - 112 mEq/L   CO2 17 (L) 19 - 32 mEq/L   Glucose, Bld 147 (H) 70 - 99 mg/dL   BUN 55 (H) 6 - 23 mg/dL   Creatinine, Ser 2.52 (H) 0.50 - 1.10 mg/dL   Calcium 8.9 8.4 - 10.5 mg/dL   Total Protein 6.0 6.0 - 8.3 g/dL   Albumin 1.6 (L) 3.5 - 5.2 g/dL   AST 111 (H) 0 - 37 U/L   ALT 33 0 - 35 U/L  Alkaline Phosphatase 964 (H) 39 - 117 U/L   Total Bilirubin 3.1 (H) 0.3 - 1.2 mg/dL   GFR calc non Af Amer 18 (L) >90 mL/min   GFR calc Af Amer 21 (L) >90 mL/min   Anion gap 22 (H) 5 - 15  Lipase, blood  Result Value Ref Range   Lipase 57 11 - 59 U/L  Type and screen  Result Value Ref Range   ABO/RH(D) A POS    Antibody Screen NEG    Sample Expiration 12/30/2013      Imaging Review Dg Chest 2 View  12/27/2013   CLINICAL DATA:  Lower abdominal pain and lower extremity swelling  EXAM: CHEST  2 VIEW  COMPARISON:  11/02/2007  FINDINGS: Cardiac shadow is within normal limits. The lungs are hyperaerated bilaterally. No focal infiltrate or sizable effusion is seen.  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   Electronically Signed   By: Inez Catalina M.D.   On: 12/27/2013 16:16     EKG Interpretation   Date/Time:  Saturday December 27 2013 12:33:58 EST Ventricular Rate:  102 PR Interval:  146 QRS Duration: 60 QT Interval:  352 QTC Calculation: 458 R Axis:   56 Text Interpretation:  Sinus tachycardia Septal infarct , age undetermined  Abnormal ECG Confirmed by Ishia Tenorio  MD, Zoraya Fiorenza [phone]) on 12/27/2013  2:41:44 PM      MDM   Final diagnoses:  Abdominal pain  Abdominal mass, RUQ (right upper quadrant)   Patient CT of her abdomen is pending. But clinically she has a very large mass right upper quadrant extending into the epigastric part of the abdomen. Very concerning for neoplastic process. Either: With the significant liver metastases or pancreatic cancer. Patient as well as jaundice. Patient as well has anemia. Patient's platelet count is low but  it is above 20,000. CT scan results will direct the appropriate admission. The patient is going to require admission.     Fredia Sorrow, MD 12/27/13 [phone]

## 2013-12-27 NOTE — ED Notes (Signed)
Pt placed on bedpan. Urine sample contaminated by feces.

## 2013-12-27 NOTE — ED Notes (Signed)
Pt reports lower abd pain and leg swelling for the past couple of days. Pt reports she is not having normal BMs and decreased appetite. Denies any cardiac hx.   e

## 2013-12-27 NOTE — ED Provider Notes (Signed)
Care transferred to me. Patient CT scan shows ascites and significant hepatomegaly. No obvious cancer seen but there is a concern for cirrhosis versus acute liver disease. Patient has had no excessive Tylenol use. No significant drinking history or prior history of liver disease. Discussed results with patient and family and will admit for further workup and treatment of acute renal failure.  Ephraim Hamburger, MD 12/27/13 [phone]

## 2013-12-27 NOTE — ED Notes (Signed)
Pt is profusely vomiting.

## 2013-12-27 NOTE — ED Notes (Signed)
Attempted report 

## 2013-12-27 NOTE — Progress Notes (Signed)
Called ED for report  

## 2013-12-28 ENCOUNTER — Inpatient Hospital Stay (HOSPITAL_COMMUNITY): Payer: Medicare Other

## 2013-12-28 DIAGNOSIS — D7589 Other specified diseases of blood and blood-forming organs: Secondary | ICD-10-CM | POA: Diagnosis present

## 2013-12-28 DIAGNOSIS — R109 Unspecified abdominal pain: Secondary | ICD-10-CM | POA: Diagnosis present

## 2013-12-28 DIAGNOSIS — N179 Acute kidney failure, unspecified: Secondary | ICD-10-CM

## 2013-12-28 DIAGNOSIS — R7401 Elevation of levels of liver transaminase levels: Secondary | ICD-10-CM

## 2013-12-28 DIAGNOSIS — R188 Other ascites: Secondary | ICD-10-CM | POA: Diagnosis present

## 2013-12-28 DIAGNOSIS — R630 Anorexia: Secondary | ICD-10-CM | POA: Diagnosis present

## 2013-12-28 DIAGNOSIS — K7689 Other specified diseases of liver: Secondary | ICD-10-CM | POA: Diagnosis present

## 2013-12-28 DIAGNOSIS — F1010 Alcohol abuse, uncomplicated: Secondary | ICD-10-CM

## 2013-12-28 DIAGNOSIS — R740 Nonspecific elevation of levels of transaminase and lactic acid dehydrogenase [LDH]: Secondary | ICD-10-CM

## 2013-12-28 DIAGNOSIS — D696 Thrombocytopenia, unspecified: Secondary | ICD-10-CM

## 2013-12-28 DIAGNOSIS — K831 Obstruction of bile duct: Secondary | ICD-10-CM | POA: Diagnosis present

## 2013-12-28 DIAGNOSIS — N189 Chronic kidney disease, unspecified: Secondary | ICD-10-CM

## 2013-12-28 LAB — URINALYSIS, ROUTINE W REFLEX MICROSCOPIC
Glucose, UA: NEGATIVE mg/dL
HGB URINE DIPSTICK: NEGATIVE
KETONES UR: 15 mg/dL — AB
Nitrite: NEGATIVE
PH: 5.5 (ref 5.0–8.0)
Protein, ur: NEGATIVE mg/dL
SPECIFIC GRAVITY, URINE: 1.018 (ref 1.005–1.030)
Urobilinogen, UA: 1 mg/dL (ref 0.0–1.0)

## 2013-12-28 LAB — CBC
HCT: 21 % — ABNORMAL LOW (ref 36.0–46.0)
HEMOGLOBIN: 6.8 g/dL — AB (ref 12.0–15.0)
MCH: 31.6 pg (ref 26.0–34.0)
MCHC: 32.4 g/dL (ref 30.0–36.0)
MCV: 97.7 fL (ref 78.0–100.0)
Platelets: 67 10*3/uL — ABNORMAL LOW (ref 150–400)
RBC: 2.15 MIL/uL — AB (ref 3.87–5.11)
RDW: 23.5 % — ABNORMAL HIGH (ref 11.5–15.5)
WBC: 13.9 10*3/uL — AB (ref 4.0–10.5)

## 2013-12-28 LAB — HEPATITIS PANEL, ACUTE
HCV AB: NEGATIVE
HEP B C IGM: NONREACTIVE
Hep A IgM: NONREACTIVE
Hepatitis B Surface Ag: NEGATIVE

## 2013-12-28 LAB — RAPID URINE DRUG SCREEN, HOSP PERFORMED
Amphetamines: NOT DETECTED
BARBITURATES: NOT DETECTED
Benzodiazepines: NOT DETECTED
Cocaine: NOT DETECTED
Opiates: NOT DETECTED
TETRAHYDROCANNABINOL: NOT DETECTED

## 2013-12-28 LAB — COMPREHENSIVE METABOLIC PANEL
ALBUMIN: 1.4 g/dL — AB (ref 3.5–5.2)
ALT: 28 U/L (ref 0–35)
ANION GAP: 16 — AB (ref 5–15)
AST: 104 U/L — ABNORMAL HIGH (ref 0–37)
Alkaline Phosphatase: 779 U/L — ABNORMAL HIGH (ref 39–117)
BUN: 57 mg/dL — AB (ref 6–23)
CALCIUM: 8.6 mg/dL (ref 8.4–10.5)
CO2: 19 mEq/L (ref 19–32)
CREATININE: 2.68 mg/dL — AB (ref 0.50–1.10)
Chloride: 98 mEq/L (ref 96–112)
GFR calc Af Amer: 20 mL/min — ABNORMAL LOW (ref >90)
GFR calc non Af Amer: 17 mL/min — ABNORMAL LOW (ref >90)
Glucose, Bld: 69 mg/dL — ABNORMAL LOW (ref 70–99)
POTASSIUM: 5.5 meq/L — AB (ref 3.7–5.3)
Sodium: 133 mEq/L — ABNORMAL LOW (ref 137–147)
Total Bilirubin: 3.3 mg/dL — ABNORMAL HIGH (ref 0.3–1.2)
Total Protein: 5.2 g/dL — ABNORMAL LOW (ref 6.0–8.3)

## 2013-12-28 LAB — IRON AND TIBC
IRON: 35 ug/dL — AB (ref 42–135)
Saturation Ratios: 16 % — ABNORMAL LOW (ref 20–55)
TIBC: 218 ug/dL — ABNORMAL LOW (ref 250–470)
UIBC: 183 ug/dL (ref 125–400)

## 2013-12-28 LAB — URINE MICROSCOPIC-ADD ON

## 2013-12-28 LAB — PREPARE RBC (CROSSMATCH)

## 2013-12-28 LAB — T4, FREE: Free T4: 0.93 ng/dL (ref 0.80–1.80)

## 2013-12-28 LAB — FERRITIN: Ferritin: 127 ng/mL (ref 10–291)

## 2013-12-28 LAB — HIV ANTIBODY (ROUTINE TESTING W REFLEX): HIV: NONREACTIVE

## 2013-12-28 LAB — FOLATE

## 2013-12-28 LAB — CLOSTRIDIUM DIFFICILE BY PCR: CDIFFPCR: NEGATIVE

## 2013-12-28 LAB — VITAMIN B12: Vitamin B-12: >2000 pg/mL — ABNORMAL HIGH (ref 211–911)

## 2013-12-28 MED ORDER — THIAMINE HCL 100 MG/ML IJ SOLN
100.0000 mg | Freq: Every day | INTRAMUSCULAR | Status: DC
  Filled 2013-12-28 (×4): qty 1

## 2013-12-28 MED ORDER — LORAZEPAM 1 MG PO TABS: 1.0000 mg | ORAL_TABLET | Freq: Four times a day (QID) | ORAL | Status: AC | PRN

## 2013-12-28 MED ORDER — SODIUM CHLORIDE 0.9 % IV SOLN
Freq: Once | INTRAVENOUS | Status: AC
  Administered 2013-12-28: 10:00:00 via INTRAVENOUS

## 2013-12-28 MED ORDER — FOLIC ACID 1 MG PO TABS
1.0000 mg | ORAL_TABLET | Freq: Every day | ORAL | Status: DC
  Administered 2013-12-28 – 2014-01-01 (×5): 1 mg via ORAL
  Filled 2013-12-28 (×5): qty 1

## 2013-12-28 MED ORDER — LORAZEPAM 2 MG/ML IJ SOLN: 1.0000 mg | Freq: Four times a day (QID) | INTRAMUSCULAR | Status: AC | PRN

## 2013-12-28 MED ORDER — PANTOPRAZOLE SODIUM 40 MG PO TBEC
40.0000 mg | DELAYED_RELEASE_TABLET | Freq: Every day | ORAL | Status: DC
  Administered 2013-12-28 – 2014-01-01 (×5): 40 mg via ORAL
  Filled 2013-12-28 (×5): qty 1

## 2013-12-28 MED ORDER — ADULT MULTIVITAMIN W/MINERALS CH
1.0000 | ORAL_TABLET | Freq: Every day | ORAL | Status: DC
  Administered 2013-12-28 – 2014-01-01 (×5): 1 via ORAL
  Filled 2013-12-28 (×5): qty 1

## 2013-12-28 MED ORDER — VITAMIN B-1 100 MG PO TABS
100.0000 mg | ORAL_TABLET | Freq: Every day | ORAL | Status: DC
  Administered 2013-12-28 – 2014-01-01 (×5): 100 mg via ORAL
  Filled 2013-12-28 (×5): qty 1

## 2013-12-28 NOTE — Plan of Care (Signed)
Problem: Phase I Progression Outcomes Goal: OOB as tolerated unless otherwise ordered Outcome: Completed/Met Date Met:  12/28/13     

## 2013-12-28 NOTE — Plan of Care (Signed)
Problem: Phase I Progression Outcomes Goal: Pain controlled with appropriate interventions Outcome: Completed/Met Date Met:  12/28/13     

## 2013-12-28 NOTE — Progress Notes (Signed)
Provider notified that serum K levels this am were 5.5. Orders obtained for cardiac monitoring. Will continue to monitor.

## 2013-12-28 NOTE — Progress Notes (Signed)
Dr. Carles Collet notified of patient's minimal urine output today. MD aware. Will continue to monitor. No new orders received.

## 2013-12-28 NOTE — H&P (Signed)
[facility] History and Physical  Patient: [name]  ZOX:[id number]  DOB: 1946-01-30  DOS: the patient was seen and examined on 12/27/2013 PCP: No primary care provider on file.  Chief Complaint: abdominal pain and leg swelling and vomiting  HPI: [name] is a 68 y.o. female with Past medical history of cholecystectomy. . The patient is presenting with complaints of abdominal pain. She mentions that the pain is primarily located on the lower quadrant. At the time of my evaluation the pain was completely resolved. While she was here in the ER she started having complaints of nausea and vomiting and had3 episodes of vomiting without any blood. She mentions at home she has been having decreased appetite since last one week. She has noted that her upper abdomen has been swelling over last 1 month and has been hard. She denies any fever or chills. She denies any sick contacts or any changes in her diet or any herbal supplements or taking a lot of Tylenol. She denies drinking a lot of alcohol. She denies use of drugs. She denies any family history of liver disease. She denies any prior knowledge of any liver disease. She does not follow up with physician on a regular basis.  The patient is coming from home. And at her baseline independent for most of her ADL.  Review of Systems: as mentioned in the history of present illness.  A Comprehensive review of the other systems is negative.  History reviewed. No pertinent past medical history. Past Surgical History  Procedure Laterality Date  . Cholecystectomy     Social History:  reports that she has been smoking Cigarettes.  She has a 50 pack-year smoking history. She does not have any smokeless tobacco history on file. She reports that she drinks alcohol. She reports that she does not use illicit drugs.  Allergies  Allergen Reactions  . Percocet [Oxycodone-Acetaminophen] Nausea Only  . Vicodin  [Hydrocodone-Acetaminophen] Nausea Only    History reviewed. No pertinent family history.  Prior to Admission medications   Medication Sig Start Date End Date Taking? Authorizing Provider  Aspirin-Salicylamide-Caffeine (BC FAST PAIN RELIEF) [phone] MG PACK Take 1 Package by mouth daily as needed (for pain).   Yes Historical Provider, MD    Physical Exam: Filed Vitals:   12/27/13 2045 12/27/13 2100 12/27/13 2203 12/27/13 2217  BP: 115/64 108/60  105/58  Pulse: 95 93  94  Temp:    98.2 F (36.8 C)  TempSrc:    Oral  Resp:  19  16  Height:   5\' 5"  (1.651 m)   Weight:   45.677 kg (100 lb 11.2 oz)   SpO2: 100% 100%  100%    General: Alert, Awake and Oriented to Time, Place and Person. Appear in mild distress Eyes: PERRL ENT: Oral Mucosa clear moist. Neck: no JVD Cardiovascular: S1 and S2 Present, no Murmur, Peripheral Pulses Present Respiratory: Bilateral Air entry equal and Decreased, Clear to Auscultation, noCrackles, no wheezes Abdomen: Bowel Sound present distended with hepatomegaly , Soft and non tender Skin: no Rash Extremities: no Pedal edema, no calf tenderness Neurologic: Grossly no focal neuro deficit. Tremors noted but negative for asterixis  Labs on Admission:  CBC:  Recent Labs Lab 12/27/13 1250 12/27/13 2122  WBC 16.5* 16.3*  NEUTROABS 13.6* 13.5*  HGB 7.2* 7.2*  HCT 23.8* 23.2*  MCV 100.0 97.1  PLT 67* 73*    CMP     Component Value Date/Time   NA 134* 12/27/2013 1250  K 5.2 12/27/2013 1250   CL 95* 12/27/2013 1250   CO2 17* 12/27/2013 1250   GLUCOSE 147* 12/27/2013 1250   BUN 55* 12/27/2013 1250   CREATININE 2.52* 12/27/2013 1250   CALCIUM 8.9 12/27/2013 1250   PROT 6.0 12/27/2013 1250   ALBUMIN 1.6* 12/27/2013 1250   AST 111* 12/27/2013 1250   ALT 33 12/27/2013 1250   ALKPHOS 964* 12/27/2013 1250   BILITOT 3.1* 12/27/2013 1250   GFRNONAA 18* 12/27/2013 1250   GFRAA 21* 12/27/2013 1250     Recent Labs Lab 12/27/13 1250   LIPASE 57    Recent Labs Lab 12/27/13 2123  AMMONIA 80*    No results for input(s): CKTOTAL, CKMB, CKMBINDEX, TROPONINI in the last 168 hours. BNP (last 3 results) No results for input(s): PROBNP in the last 8760 hours.  Radiological Exams on Admission: Ct Abdomen Pelvis Wo Contrast  12/27/2013   CLINICAL DATA:  Lower abdominal pain for 10 days, status postcholecystectomy  EXAM: CT ABDOMEN AND PELVIS WITHOUT CONTRAST  TECHNIQUE: Multidetector CT imaging of the abdomen and pelvis was performed following the standard protocol without IV contrast.  COMPARISON:  07/28/2007.  FINDINGS: Lung bases are unremarkable. Sagittal images of the spine are unremarkable. There is small perihepatic ascites. There is hepatomegaly. The liver measures at least 22 cm in length. There is micro nodular liver contour highly suspicious for chronic liver disease or early cirrhosis.  The study is limited without IV contrast. The patient is status postcholecystectomy.  Atherosclerotic calcifications of abdominal aorta and iliac arteries. Unenhanced pancreas and spleen is unremarkable. The spleen measures 8.9 cm in length.  Adrenal glands are unremarkable. Unenhanced kidneys are symmetrical in size. There is a probable cyst in midpole of the right kidney measures 2 cm. No nephrolithiasis. No hydronephrosis or hydroureter. No calcified ureteral calculi are noted. Small ascites is noted in right paracolic gutter.  Small pelvic ascites. Oral contrast material was given to the patient. No small bowel obstruction. No colonic obstruction. Contrast material noted all the way in rectosigmoid colon.  There is small left inguinal hernia containing ascites measures about 1.2 cm. There is old fracture deformity of the left inferior pubic ramus.  The uterus is not identified.  No inguinal adenopathy.  IMPRESSION: 1. There is perihepatic ascites. Significant hepatomegaly. There is micro nodular liver contour. Chronic liver disease or  cirrhosis cannot be excluded. Clinical correlation is necessary. 2. The patient is status post cholecystectomy.  No splenomegaly. 3. There is a cyst in midpole of the right kidney measures 2 cm. No nephrolithiasis. No hydronephrosis or hydroureter. 4. Atherosclerotic calcifications of abdominal aorta and iliac arteries. 5. Small pelvic ascites. The uterus is not identified. No small bowel or colonic obstruction.   Electronically Signed   By: Lahoma Crocker M.D.   On: 12/27/2013 18:31   Dg Chest 2 View  12/27/2013   CLINICAL DATA:  Lower abdominal pain and lower extremity swelling  EXAM: CHEST  2 VIEW  COMPARISON:  11/02/2007  FINDINGS: Cardiac shadow is within normal limits. The lungs are hyperaerated bilaterally. No focal infiltrate or sizable effusion is seen.  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   Electronically Signed   By: Inez Catalina M.D.   On: 12/27/2013 16:16   Assessment/Plan Principal Problem:   AKI (acute kidney injury) Active Problems:   Obstructive cholestatic liver disease   Bicytopenia   Abdominal pain   Decrease in appetite   Ascites   1. AKI (acute kidney injury) The patient is  presenting with complaints of nausea vomiting or appetite. She appears to have increase in her serum creatinine from her baseline along with increased BUN. Etiology currently is unclear but most likely hypovolemia and prerenal She is receiving gentle IV hydration we will continue monitoring her BMP. Avoid nephrotoxic medications.  2.obstructive cholestatic liver disease. Patient appears to have significant elevation of her alkaline phosphatase as well as GGT with increase in diuretic bilirubin. CT of the abdomen is showing micronodular disease suggesting chronic liver disease. Patient denies any alcohol abuse. Patient does have some tremors but negative for asterixis but ammonia level is significantly elevated. Currently the etiology of her liver disease and unclear Discuss with GI in the  morning. At present avoid any hepatotoxic medication. Due to her ascites and abdominal pain I would give her as BP prophylaxis with IV ceftriaxone. I would also give her lactulose due to her elevated ammonia level. Workup according to GI.  3.bicytopenia. Patient appears to have anemia as well as thrombocytopenia. Her anemia appears to be remaining stable. Would open further workup for anemia panel. , Cytopenia and anemia are likely secondary to liver disease. Continue close monitoring and transfuse as needed. Type and cross done. Check Hemoccult.  Advance goals of care discussion: full code   Consults: gastroenterology  DVT Prophylaxis: mechanical compression devicedue to anemia and thrombocytopenia  Nutrition: nothing by mouth except medications after midnight  Family Communication: family was present at bedside, opportunity was given to ask question and all questions were answered satisfactorily at the time of interview. Disposition: Admitted to inpatient in telemetry unit.  Author: Berle Mull, MD [facility] Pager: [phone]  If 7PM-7AM, please contact night-coverage [email or web address] Password [email or web address]

## 2013-12-28 NOTE — Plan of Care (Signed)
Problem: Phase I Progression Outcomes Goal: Voiding-avoid urinary catheter unless indicated Outcome: Completed/Met Date Met:  12/28/13

## 2013-12-28 NOTE — Consult Note (Addendum)
Consult for [facility]  Reason for Consult: Cirrhosis Referring Physician: [facility]  [name] HPI: This is a 68 year old female with a PMH of cholecystectomy admitted for lower abdominal pain.  During the work up of her pain she as identified to have a cirrhotic liver and some mild perihepatic ascites.  Her abdominal pain has resolved, but she is admitted for further evaluation of the cirrhosis.  She denies any prior history of IVDA, but she does drink socially.  On average she will have a couple of drinks per month.  There is no known family history of liver disease or Sarcoidosis.  She denies any issues with autoimmune disease.  Her AP in 2009 was at 20 and how it has elevated into the 900 range.  The patient denies taking any type of medications or herbal supplements.  Her HBV and HCV serologies are negative.  History reviewed. No pertinent past medical history.  Past Surgical History  Procedure Laterality Date  . Cholecystectomy      History reviewed. No pertinent family history.  Social History:  reports that she has been smoking Cigarettes.  She has a 50 pack-year smoking history. She does not have any smokeless tobacco history on file. She reports that she drinks alcohol. She reports that she does not use illicit drugs.  Allergies:  Allergies  Allergen Reactions  . Percocet [Oxycodone-Acetaminophen] Nausea Only  . Vicodin [Hydrocodone-Acetaminophen] Nausea Only    Medications:  Scheduled: . sodium chloride   Intravenous Once  . cefTRIAXone (ROCEPHIN)  IV  1 g Intravenous Q24H  . lactulose  20 g Oral BID  . ondansetron  4 mg Intravenous Once  . pantoprazole (PROTONIX) IV  40 mg Intravenous Q24H   Continuous: . sodium chloride 50 mL/hr at 12/28/13 0732    Results for orders placed or performed during the hospital encounter of 12/27/13 (from the past 24 hour(s))  CBC WITH DIFFERENTIAL     Status: Abnormal   Collection Time: 12/27/13 12:50 PM  Result  Value Ref Range   WBC 16.5 (H) 4.0 - 10.5 K/uL   RBC 2.38 (L) 3.87 - 5.11 MIL/uL   Hemoglobin 7.2 (L) 12.0 - 15.0 g/dL   HCT 23.8 (L) 36.0 - 46.0 %   MCV 100.0 78.0 - 100.0 fL   MCH 30.3 26.0 - 34.0 pg   MCHC 30.3 30.0 - 36.0 g/dL   RDW 23.4 (H) 11.5 - 15.5 %   Platelets 67 (L) 150 - 400 K/uL   Neutrophils Relative % 83 (H) 43 - 77 %   Lymphocytes Relative 6 (L) 12 - 46 %   Monocytes Relative 9 3 - 12 %   Eosinophils Relative 1 0 - 5 %   Basophils Relative 1 0 - 1 %   Neutro Abs 13.6 (H) 1.7 - 7.7 K/uL   Lymphs Abs 1.0 0.7 - 4.0 K/uL   Monocytes Absolute 1.5 (H) 0.1 - 1.0 K/uL   Eosinophils Absolute 0.2 0.0 - 0.7 K/uL   Basophils Absolute 0.2 (H) 0.0 - 0.1 K/uL   RBC Morphology POLYCHROMASIA PRESENT   Comprehensive metabolic panel     Status: Abnormal   Collection Time: 12/27/13 12:50 PM  Result Value Ref Range   Sodium 134 (L) 137 - 147 mEq/L   Potassium 5.2 3.7 - 5.3 mEq/L   Chloride 95 (L) 96 - 112 mEq/L   CO2 17 (L) 19 - 32 mEq/L   Glucose, Bld 147 (H) 70 - 99 mg/dL  BUN 55 (H) 6 - 23 mg/dL   Creatinine, Ser 2.52 (H) 0.50 - 1.10 mg/dL   Calcium 8.9 8.4 - 10.5 mg/dL   Total Protein 6.0 6.0 - 8.3 g/dL   Albumin 1.6 (L) 3.5 - 5.2 g/dL   AST 111 (H) 0 - 37 U/L   ALT 33 0 - 35 U/L   Alkaline Phosphatase 964 (H) 39 - 117 U/L   Total Bilirubin 3.1 (H) 0.3 - 1.2 mg/dL   GFR calc non Af Amer 18 (L) >90 mL/min   GFR calc Af Amer 21 (L) >90 mL/min   Anion gap 22 (H) 5 - 15  Lipase, blood     Status: None   Collection Time: 12/27/13 12:50 PM  Result Value Ref Range   Lipase 57 11 - 59 U/L  Type and screen     Status: None (Preliminary result)   Collection Time: 12/27/13  2:55 PM  Result Value Ref Range   ABO/RH(D) A POS    Antibody Screen NEG    Sample Expiration 12/30/2013    Unit Number [id number]    Blood Component Type RED CELLS,LR    Unit division 00    Status of Unit ALLOCATED    Transfusion Status OK TO TRANSFUSE    Crossmatch Result Compatible    Unit  Number [id number]    Blood Component Type RED CELLS,LR    Unit division 00    Status of Unit ALLOCATED    Transfusion Status OK TO TRANSFUSE    Crossmatch Result Compatible   Ethanol     Status: None   Collection Time: 12/27/13  9:22 PM  Result Value Ref Range   Alcohol, Ethyl (B) <11 0 - 11 mg/dL  Acetaminophen level     Status: None   Collection Time: 12/27/13  9:22 PM  Result Value Ref Range   Acetaminophen (Tylenol), Serum <15.0 10 - 30 ug/mL  Salicylate level     Status: Abnormal   Collection Time: 12/27/13  9:22 PM  Result Value Ref Range   Salicylate Lvl <6.3 (L) 2.8 - 20.0 mg/dL  Hepatitis panel, acute     Status: None   Collection Time: 12/27/13  9:22 PM  Result Value Ref Range   Hepatitis B Surface Ag NEGATIVE NEGATIVE   HCV Ab NEGATIVE NEGATIVE   Hep A IgM NON REACTIVE NON REACTIVE   Hep B C IgM NON REACTIVE NON REACTIVE  Gamma GT     Status: Abnormal   Collection Time: 12/27/13  9:22 PM  Result Value Ref Range   GGT 653 (H) 7 - 51 U/L  Reticulocytes     Status: Abnormal   Collection Time: 12/27/13  9:22 PM  Result Value Ref Range   Retic Ct Pct 7.0 (H) 0.4 - 3.1 %   RBC. 2.39 (L) 3.87 - 5.11 MIL/uL   Retic Count, Manual 167.3 19.0 - 186.0 K/uL  Vitamin B12     Status: Abnormal   Collection Time: 12/27/13  9:22 PM  Result Value Ref Range   Vitamin B-12 >2000 (H) 211 - 911 pg/mL  Folate     Status: None   Collection Time: 12/27/13  9:22 PM  Result Value Ref Range   Folate >20.0 ng/mL  Bilirubin, direct     Status: Abnormal   Collection Time: 12/27/13  9:22 PM  Result Value Ref Range   Bilirubin, Direct 3.0 (H) 0.0 - 0.3 mg/dL  CBC with Differential     Status: Abnormal  Collection Time: 12/27/13  9:22 PM  Result Value Ref Range   WBC 16.3 (H) 4.0 - 10.5 K/uL   RBC 2.39 (L) 3.87 - 5.11 MIL/uL   Hemoglobin 7.2 (L) 12.0 - 15.0 g/dL   HCT 23.2 (L) 36.0 - 46.0 %   MCV 97.1 78.0 - 100.0 fL   MCH 30.1 26.0 - 34.0 pg   MCHC 31.0 30.0 - 36.0 g/dL   RDW  23.2 (H) 11.5 - 15.5 %   Platelets 73 (L) 150 - 400 K/uL   Neutrophils Relative % 83 (H) 43 - 77 %   Lymphocytes Relative 10 (L) 12 - 46 %   Monocytes Relative 6 3 - 12 %   Eosinophils Relative 1 0 - 5 %   Basophils Relative 0 0 - 1 %   Neutro Abs 13.5 (H) 1.7 - 7.7 K/uL   Lymphs Abs 1.6 0.7 - 4.0 K/uL   Monocytes Absolute 1.0 0.1 - 1.0 K/uL   Eosinophils Absolute 0.2 0.0 - 0.7 K/uL   Basophils Absolute 0.0 0.0 - 0.1 K/uL   RBC Morphology TARGET CELLS    WBC Morphology ATYPICAL LYMPHOCYTES   Protime-INR     Status: Abnormal   Collection Time: 12/27/13  9:22 PM  Result Value Ref Range   Prothrombin Time 17.3 (H) 11.6 - 15.2 seconds   INR 1.40 0.00 - 1.49  APTT     Status: Abnormal   Collection Time: 12/27/13  9:22 PM  Result Value Ref Range   aPTT 45 (H) 24 - 37 seconds  TSH     Status: None   Collection Time: 12/27/13  9:23 PM  Result Value Ref Range   TSH 4.090 0.350 - 4.500 uIU/mL  Ammonia     Status: Abnormal   Collection Time: 12/27/13  9:23 PM  Result Value Ref Range   Ammonia 80 (H) 11 - 60 umol/L  Comprehensive metabolic panel     Status: Abnormal   Collection Time: 12/28/13  7:37 AM  Result Value Ref Range   Sodium 133 (L) 137 - 147 mEq/L   Potassium 5.5 (H) 3.7 - 5.3 mEq/L   Chloride 98 96 - 112 mEq/L   CO2 19 19 - 32 mEq/L   Glucose, Bld 69 (L) 70 - 99 mg/dL   BUN 57 (H) 6 - 23 mg/dL   Creatinine, Ser 2.68 (H) 0.50 - 1.10 mg/dL   Calcium 8.6 8.4 - 10.5 mg/dL   Total Protein 5.2 (L) 6.0 - 8.3 g/dL   Albumin 1.4 (L) 3.5 - 5.2 g/dL   AST 104 (H) 0 - 37 U/L   ALT 28 0 - 35 U/L   Alkaline Phosphatase 779 (H) 39 - 117 U/L   Total Bilirubin 3.3 (H) 0.3 - 1.2 mg/dL   GFR calc non Af Amer 17 (L) >90 mL/min   GFR calc Af Amer 20 (L) >90 mL/min   Anion gap 16 (H) 5 - 15  CBC     Status: Abnormal   Collection Time: 12/28/13  7:37 AM  Result Value Ref Range   WBC 13.9 (H) 4.0 - 10.5 K/uL   RBC 2.15 (L) 3.87 - 5.11 MIL/uL   Hemoglobin 6.8 (LL) 12.0 - 15.0 g/dL    HCT 21.0 (L) 36.0 - 46.0 %   MCV 97.7 78.0 - 100.0 fL   MCH 31.6 26.0 - 34.0 pg   MCHC 32.4 30.0 - 36.0 g/dL   RDW 23.5 (H) 11.5 - 15.5 %   Platelets 67 (  L) 150 - 400 K/uL     Ct Abdomen Pelvis Wo Contrast  12/27/2013   CLINICAL DATA:  Lower abdominal pain for 10 days, status postcholecystectomy  EXAM: CT ABDOMEN AND PELVIS WITHOUT CONTRAST  TECHNIQUE: Multidetector CT imaging of the abdomen and pelvis was performed following the standard protocol without IV contrast.  COMPARISON:  07/28/2007.  FINDINGS: Lung bases are unremarkable. Sagittal images of the spine are unremarkable. There is small perihepatic ascites. There is hepatomegaly. The liver measures at least 22 cm in length. There is micro nodular liver contour highly suspicious for chronic liver disease or early cirrhosis.  The study is limited without IV contrast. The patient is status postcholecystectomy.  Atherosclerotic calcifications of abdominal aorta and iliac arteries. Unenhanced pancreas and spleen is unremarkable. The spleen measures 8.9 cm in length.  Adrenal glands are unremarkable. Unenhanced kidneys are symmetrical in size. There is a probable cyst in midpole of the right kidney measures 2 cm. No nephrolithiasis. No hydronephrosis or hydroureter. No calcified ureteral calculi are noted. Small ascites is noted in right paracolic gutter.  Small pelvic ascites. Oral contrast material was given to the patient. No small bowel obstruction. No colonic obstruction. Contrast material noted all the way in rectosigmoid colon.  There is small left inguinal hernia containing ascites measures about 1.2 cm. There is old fracture deformity of the left inferior pubic ramus.  The uterus is not identified.  No inguinal adenopathy.  IMPRESSION: 1. There is perihepatic ascites. Significant hepatomegaly. There is micro nodular liver contour. Chronic liver disease or cirrhosis cannot be excluded. Clinical correlation is necessary. 2. The patient is status  post cholecystectomy.  No splenomegaly. 3. There is a cyst in midpole of the right kidney measures 2 cm. No nephrolithiasis. No hydronephrosis or hydroureter. 4. Atherosclerotic calcifications of abdominal aorta and iliac arteries. 5. Small pelvic ascites. The uterus is not identified. No small bowel or colonic obstruction.   Electronically Signed   By: Lahoma Crocker M.D.   On: 12/27/2013 18:31   Dg Chest 2 View  12/27/2013   CLINICAL DATA:  Lower abdominal pain and lower extremity swelling  EXAM: CHEST  2 VIEW  COMPARISON:  11/02/2007  FINDINGS: Cardiac shadow is within normal limits. The lungs are hyperaerated bilaterally. No focal infiltrate or sizable effusion is seen.  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   Electronically Signed   By: Inez Catalina M.D.   On: 12/27/2013 16:16    ROS:  As stated above in the HPI otherwise negative.  Blood pressure 97/42, pulse 95, temperature 98.5 F (36.9 C), temperature source Oral, resp. rate 24, height 5\' 5"  (1.651 m), weight 45.677 kg (100 lb 11.2 oz), SpO2 98 %.    PE: Gen: NAD, Alert and Oriented, very thin HEENT:  [place]/AT, EOMI Neck: Supple, no LAD Lungs: CTA Bilaterally CV: RRR without M/G/R ABM: Soft, NTND, +BS, palpable liver - firm and 14 cm below the costal margin Ext: No C/C/E  Assessment/Plan: 1) Cryptogenic cirrhosis. 2) Mild ascites.   The etiology of her cirrhosis is not known.  Further evaluation is required with the current findings.  She has a significant increase in her AP since 2009.  Plan: 1) Check iron panel, ferritin, alpha-1-antitrypsin, ANA, AMA, ASMA, total IgG level, and ACE level.  If the serologies are unrevealing a liver biopsy may be required. 2) She will need an EGD at some point to screen for varices.  Shawnta Zimbelman D 12/28/2013, 9:31 AM

## 2013-12-28 NOTE — Plan of Care (Signed)
Problem: Phase I Progression Outcomes Goal: Initial discharge plan identified Outcome: Progressing Goal: Hemodynamically stable Outcome: Progressing

## 2013-12-28 NOTE — Progress Notes (Signed)
NURSING PROGRESS NOTE  [name] [id number] Admission Data: 12/28/2013 6:28 AM Attending Provider: Berle Mull, MD PCP:No primary care provider on file. Code Status: full   [name] is a 68 y.o. female patient admitted from ED:  -No acute distress noted.  -No complaints of shortness of breath.  -No complaints of chest pain.    IV Fluids:  IV in place, occlusive dsg intact without redness, IV cath hand right, condition patent and no redness normal saline.   Allergies:  Percocet and Vicodin  Past Medical History:   has no past medical history on file.  Past Surgical History:   has past surgical history that includes Cholecystectomy.  Social History:   reports that she has been smoking Cigarettes.  She has a 50 pack-year smoking history. She does not have any smokeless tobacco history on file. She reports that she drinks alcohol. She reports that she does not use illicit drugs.  Skin: small bruised area on left lateral ankle  Patient/Family oriented to room. Information packet given to patient/family. Admission inpatient armband information verified with patient/family to include name and date of birth and placed on patient arm. Side rails up x 2, fall assessment and education completed with patient/family. Patient/family able to verbalize understanding of risk associated with falls and verbalized understanding to call for assistance before getting out of bed. Call light within reach. Patient/family able to voice and demonstrate understanding of unit orientation instructions.

## 2013-12-28 NOTE — Progress Notes (Signed)
CRITICAL VALUE ALERT  Critical value received:  Hgb. 6.8   Date of notification:  12/28/2013  Time of notification:  8:25 AM  Critical value read back:Yes.    Nurse who received alert:  Merrily Pew  MD notified (1st page):  Dr. Carles Collet  Time of first page:  8:26 AM  MD notified (2nd page):  Time of second page:  Responding MD:  Dr. Carles Collet  Time MD responded:  8:28 AM

## 2013-12-28 NOTE — Progress Notes (Signed)
PROGRESS NOTE  SYLA DEVOSS IRS:[id number] DOB: Aug 18, 1945 DOA: 12/27/2013 PCP: No primary care provider on file.  68 year old female with a history of alcohol abuse, tobacco use presented with 3 week history of worsening abdominal pain and today history of nausea and vomiting. The patient denied any hematemesis, coffee grounds emesis, hematochezia, melena. The patient continues to drink alcohol, which at this point she states that she only drinks 2 times per month (drinks 4 drinks of whiskey at one sitting). Previously, the patient has been drinking every other day for the past 30-40 years. The patient continues to smoke, having at least 30-pack-year history. She denied any NSAIDs but stated that she used some BC powder 6 days prior to this admission.  The patient does not have a primary care provider, and has essentially not seen a physician in approximately 3 years.  Assessment/Plan: Acute on chronic renal failure ( CKD 3) -Likely due to volume depletion -full abdominal ultrasound to also include kidneys -IV fluid resuscitation Abdominal pain with vomiting/Tranasminasemia/Ascites -Suspect the patient has degree of esophagitis/gastritis from her alcohol use as well as NSAIDs. -CT abdomen and pelvis showed small amount of perihepatic ascites as well as pelvic ascites -will try to schedule paracentesis -Unfortunately, the patient was given ceftriaxone last night which will decrease the yield of her cultures -discontinue antibiotics and observe -Plan to restart antibiotics if the patient becomes unstable clinically -Vital hepatitis serologies negative Leukocytosis -UA and urine culture -Plan for paracentesis with cell count and cultures -chest x-ray negative for infiltrates Alcohol abuse -Alcohol withdrawal protocol Thrombocytopenia -Likely due to the patient's chronic alcohol use and cirrhosis of liver -Check HIV antibody Macrocytic anemia -GI has been consulted -I  have personally performed a rectal examination--> Hemoccult-positive, brown stool, no masses -Transfuse 2 units PRBC    Family Communication:   Daughter updated at beside Disposition Plan:   Home when medically stable       Procedures/Studies: Ct Abdomen Pelvis Wo Contrast  12/27/2013   CLINICAL DATA:  Lower abdominal pain for 10 days, status postcholecystectomy  EXAM: CT ABDOMEN AND PELVIS WITHOUT CONTRAST  TECHNIQUE: Multidetector CT imaging of the abdomen and pelvis was performed following the standard protocol without IV contrast.  COMPARISON:  07/28/2007.  FINDINGS: Lung bases are unremarkable. Sagittal images of the spine are unremarkable. There is small perihepatic ascites. There is hepatomegaly. The liver measures at least 22 cm in length. There is micro nodular liver contour highly suspicious for chronic liver disease or early cirrhosis.  The study is limited without IV contrast. The patient is status postcholecystectomy.  Atherosclerotic calcifications of abdominal aorta and iliac arteries. Unenhanced pancreas and spleen is unremarkable. The spleen measures 8.9 cm in length.  Adrenal glands are unremarkable. Unenhanced kidneys are symmetrical in size. There is a probable cyst in midpole of the right kidney measures 2 cm. No nephrolithiasis. No hydronephrosis or hydroureter. No calcified ureteral calculi are noted. Small ascites is noted in right paracolic gutter.  Small pelvic ascites. Oral contrast material was given to the patient. No small bowel obstruction. No colonic obstruction. Contrast material noted all the way in rectosigmoid colon.  There is small left inguinal hernia containing ascites measures about 1.2 cm. There is old fracture deformity of the left inferior pubic ramus.  The uterus is not identified.  No inguinal adenopathy.  IMPRESSION: 1. There is perihepatic ascites. Significant hepatomegaly. There is micro nodular liver contour. Chronic liver disease or cirrhosis cannot  be excluded. Clinical correlation is necessary. 2. The patient is status post cholecystectomy.  No splenomegaly. 3. There is a cyst in midpole of the right kidney measures 2 cm. No nephrolithiasis. No hydronephrosis or hydroureter. 4. Atherosclerotic calcifications of abdominal aorta and iliac arteries. 5. Small pelvic ascites. The uterus is not identified. No small bowel or colonic obstruction.   Electronically Signed   By: Lahoma Crocker M.D.   On: 12/27/2013 18:31   Dg Chest 2 View  12/27/2013   CLINICAL DATA:  Lower abdominal pain and lower extremity swelling  EXAM: CHEST  2 VIEW  COMPARISON:  11/02/2007  FINDINGS: Cardiac shadow is within normal limits. The lungs are hyperaerated bilaterally. No focal infiltrate or sizable effusion is seen.  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   Electronically Signed   By: Inez Catalina M.D.   On: 12/27/2013 16:16         Subjective: Patient states the nausea and vomiting have improved. She continues to have abdominal pain. Denies any fevers, chills, chest pain, shortness of breath, dysuria, hematuria. No rashes.  Objective: Filed Vitals:   12/27/13 2100 12/27/13 2203 12/27/13 2217 12/28/13 0505  BP: 108/60  105/58 97/42  Pulse: 93  94 95  Temp:   98.2 F (36.8 C) 98.5 F (36.9 C)  TempSrc:   Oral Oral  Resp: 19  16 24   Height:  5\' 5"  (1.651 m)    Weight:  45.677 kg (100 lb 11.2 oz)  45.677 kg (100 lb 11.2 oz)  SpO2: 100%  100% 98%    Intake/Output Summary (Last 24 hours) at 12/28/13 0932 Last data filed at 12/28/13 0732  Gross per 24 hour  Intake 624.17 ml  Output      0 ml  Net 624.17 ml   Weight change:  Exam:   General:  Pt is alert, follows commands appropriately, not in acute distress  HEENT: No icterus, No thrush, No meningismus, [place]/AT  Cardiovascular: RRR, S1/S2, no rubs, no gallops  Respiratory: right basilar crackles. Left clear to auscultation. No wheezing  Abdomen: Soft/+BS, epigastric and left upper quadrant  tenderness without any rebound, non distended, no guarding  Extremities: No edema, No lymphangitis, No petechiae, No rashes, no synovitis  Data Reviewed: Basic Metabolic Panel:  Recent Labs Lab 12/27/13 1250 12/28/13 0737  NA 134* 133*  K 5.2 5.5*  CL 95* 98  CO2 17* 19  GLUCOSE 147* 69*  BUN 55* 57*  CREATININE 2.52* 2.68*  CALCIUM 8.9 8.6   Liver Function Tests:  Recent Labs Lab 12/27/13 1250 12/28/13 0737  AST 111* 104*  ALT 33 28  ALKPHOS 964* 779*  BILITOT 3.1* 3.3*  PROT 6.0 5.2*  ALBUMIN 1.6* 1.4*    Recent Labs Lab 12/27/13 1250  LIPASE 57    Recent Labs Lab 12/27/13 2123  AMMONIA 80*   CBC:  Recent Labs Lab 12/27/13 1250 12/27/13 2122 12/28/13 0737  WBC 16.5* 16.3* 13.9*  NEUTROABS 13.6* 13.5*  --   HGB 7.2* 7.2* 6.8*  HCT 23.8* 23.2* 21.0*  MCV 100.0 97.1 97.7  PLT 67* 73* 67*   Cardiac Enzymes: No results for input(s): CKTOTAL, CKMB, CKMBINDEX, TROPONINI in the last 168 hours. BNP: Invalid input(s): POCBNP CBG: No results for input(s): GLUCAP in the last 168 hours.  No results found for this or any previous visit (from the past 240 hour(s)).   Scheduled Meds: . sodium chloride   Intravenous Once  . cefTRIAXone (ROCEPHIN)  IV  1 g Intravenous  Q24H  . lactulose  20 g Oral BID  . ondansetron  4 mg Intravenous Once  . pantoprazole (PROTONIX) IV  40 mg Intravenous Q24H   Continuous Infusions: . sodium chloride 50 mL/hr at 12/28/13 0732     Bernadene Garside, DO  [facility] Pager [phone]  If 7PM-7AM, please contact night-coverage [email or web address] Password [email or web address] 12/28/2013, 9:32 AM   LOS: 1 day

## 2013-12-29 ENCOUNTER — Encounter (HOSPITAL_COMMUNITY): Payer: Self-pay | Admitting: Physician Assistant

## 2013-12-29 DIAGNOSIS — R1011 Right upper quadrant pain: Secondary | ICD-10-CM

## 2013-12-29 DIAGNOSIS — D696 Thrombocytopenia, unspecified: Secondary | ICD-10-CM

## 2013-12-29 LAB — COMPREHENSIVE METABOLIC PANEL
ALT: 29 U/L (ref 0–35)
AST: 104 U/L — AB (ref 0–37)
Albumin: 1.4 g/dL — ABNORMAL LOW (ref 3.5–5.2)
Alkaline Phosphatase: 763 U/L — ABNORMAL HIGH (ref 39–117)
Anion gap: 23 — ABNORMAL HIGH (ref 5–15)
BUN: 61 mg/dL — ABNORMAL HIGH (ref 6–23)
CALCIUM: 8.8 mg/dL (ref 8.4–10.5)
CO2: 13 mEq/L — ABNORMAL LOW (ref 19–32)
Chloride: 100 mEq/L (ref 96–112)
Creatinine, Ser: 3.03 mg/dL — ABNORMAL HIGH (ref 0.50–1.10)
GFR calc Af Amer: 17 mL/min — ABNORMAL LOW (ref >90)
GFR calc non Af Amer: 15 mL/min — ABNORMAL LOW (ref >90)
Glucose, Bld: 40 mg/dL — CL (ref 70–99)
Potassium: 5.7 mEq/L — ABNORMAL HIGH (ref 3.7–5.3)
Sodium: 136 mEq/L — ABNORMAL LOW (ref 137–147)
TOTAL PROTEIN: 5.4 g/dL — AB (ref 6.0–8.3)
Total Bilirubin: 4.7 mg/dL — ABNORMAL HIGH (ref 0.3–1.2)

## 2013-12-29 LAB — TYPE AND SCREEN
ABO/RH(D): A POS
ANTIBODY SCREEN: NEGATIVE
UNIT DIVISION: 0
Unit division: 0

## 2013-12-29 LAB — CBC
HCT: 36 % (ref 36.0–46.0)
Hemoglobin: 11.8 g/dL — ABNORMAL LOW (ref 12.0–15.0)
MCH: 31.4 pg (ref 26.0–34.0)
MCHC: 32.8 g/dL (ref 30.0–36.0)
MCV: 95.7 fL (ref 78.0–100.0)
Platelets: 78 10*3/uL — ABNORMAL LOW (ref 150–400)
RBC: 3.76 MIL/uL — AB (ref 3.87–5.11)
RDW: 20.5 % — ABNORMAL HIGH (ref 11.5–15.5)
WBC: 15.8 10*3/uL — AB (ref 4.0–10.5)

## 2013-12-29 LAB — ANTI-SMOOTH MUSCLE ANTIBODY, IGG: F-Actin IgG: 16 U (ref <20)

## 2013-12-29 LAB — OCCULT BLOOD X 1 CARD TO LAB, STOOL: Fecal Occult Bld: NEGATIVE

## 2013-12-29 LAB — ANA: ANA: NEGATIVE

## 2013-12-29 LAB — GLUCOSE, CAPILLARY: Glucose-Capillary: 143 mg/dL — ABNORMAL HIGH (ref 70–99)

## 2013-12-29 LAB — ANGIOTENSIN CONVERTING ENZYME: ANGIOTENSIN-CONVERTING ENZYME: 77 U/L — AB (ref 8–52)

## 2013-12-29 LAB — MITOCHONDRIAL ANTIBODIES: Mitochondrial M2 Ab, IgG: 0.32 (ref <0.91)

## 2013-12-29 MED ORDER — SODIUM CHLORIDE 0.9 % IV SOLN: INTRAVENOUS | Status: DC

## 2013-12-29 MED ORDER — DEXTROSE-NACL 5-0.9 % IV SOLN
INTRAVENOUS | Status: DC
  Administered 2013-12-29: 08:00:00 via INTRAVENOUS
  Filled 2013-12-29 (×6): qty 1000

## 2013-12-29 MED ORDER — LOPERAMIDE HCL 2 MG PO CAPS
2.0000 mg | ORAL_CAPSULE | ORAL | Status: DC | PRN
  Administered 2013-12-29: 2 mg via ORAL
  Filled 2013-12-29: qty 1

## 2013-12-29 MED ORDER — DEXTROSE 50 % IV SOLN
INTRAVENOUS | Status: AC
  Filled 2013-12-29: qty 50

## 2013-12-29 MED ORDER — DEXTROSE 50 % IV SOLN: 25.0000 mL | Freq: Once | INTRAVENOUS | Status: AC | PRN

## 2013-12-29 MED ORDER — DEXTROSE-NACL 5-0.9 % IV SOLN
INTRAVENOUS | Status: DC
  Administered 2013-12-29 – 2013-12-30 (×2): via INTRAVENOUS

## 2013-12-29 NOTE — Progress Notes (Signed)
Daily Rounding Note  12/29/2013, 9:34 AM  LOS: 2 days   SUBJECTIVE:       No  Further abdominal pain.  Large brown stool after lactulose.   Feels tired.   OBJECTIVE:         Vital signs in last 24 hours:    Temp:  [97.4 F (36.3 C)-98.7 F (37.1 C)] 98 F (36.7 C) (11/16 0500) Pulse Rate:  [87-98] 89 (11/16 0500) Resp:  [12-16] 12 (11/16 0500) BP: (96-138)/(54-77) 114/54 mmHg (11/16 0500) SpO2:  [92 %-100 %] 96 % (11/16 0500) Weight:  [86 lb 12.8 oz (39.372 kg)] 86 lb 12.8 oz (39.372 kg) (11/16 0500) Last BM Date: 12/29/13 Filed Weights   12/27/13 2203 12/28/13 0505 12/29/13 0500  Weight: 100 lb 11.2 oz (45.677 kg) 100 lb 11.2 oz (45.677 kg) 86 lb 12.8 oz (39.372 kg)   General: looks cachectic and chronically unwell   Dentitiion: lots of caries. Most teeth are intact Heart: RRR Chest: clear.  No labored breathing or cough Abdomen: thin, hepatomegaly with liver edge clearly palpable and reaching just above unbilicus.  Slightly tender.   Extremities: no CCE.  Wasting of muscles in all limbs.  Neuro/Psych:  Oriented x 3.  Slow but accurate and appropriated responses to questions.  Slight asterixis.   Intake/Output from previous day: 11/15 0701 - 11/16 0700 In: 1317.5 [I.V.:597.5; Blood:670; IV Piggyback:50] Out: 74 [Urine:74]  Intake/Output this shift:    Lab Results:  Recent Labs  12/27/13 2122 12/28/13 0737 12/29/13 0457  WBC 16.3* 13.9* 15.8*  HGB 7.2* 6.8* 11.8*  HCT 23.2* 21.0* 36.0  PLT 73* 67* 78*  MCV     97  BMET  Recent Labs  12/27/13 1250 12/28/13 0737 12/29/13 0457  NA 134* 133* 136*  K 5.2 5.5* 5.7*  CL 95* 98 100  CO2 17* 19 13*  GLUCOSE 147* 69* 40*  BUN 55* 57* 61*  CREATININE 2.52* 2.68* 3.03*  CALCIUM 8.9 8.6 8.8   LFT  Recent Labs  12/27/13 1250 12/27/13 2122 12/28/13 0737 12/29/13 0457  PROT 6.0  --  5.2* 5.4*  ALBUMIN 1.6*  --  1.4* 1.4*  AST 111*  --  104*  104*  ALT 33  --  28 29  ALKPHOS 964*  --  779* 763*  BILITOT 3.1*  --  3.3* 4.7*  BILIDIR  --  3.0*  --   --    PT/INR  Recent Labs  12/27/13 2122  LABPROT 17.3*  INR 1.40   Hepatitis Panel  Recent Labs  12/27/13 2122  HEPBSAG NEGATIVE  HCVAB NEGATIVE  HEPAIGM NON REACTIVE  HEPBIGM NON REACTIVE    Studies/Results: Ct Abdomen Pelvis Wo Contrast  12/27/2013   CLINICAL DATA:  Lower abdominal pain for 10 days, status postcholecystectomy  EXAM: CT ABDOMEN AND PELVIS WITHOUT CONTRAST  TECHNIQUE: Multidetector CT imaging of the abdomen and pelvis was performed following the standard protocol without IV contrast.  COMPARISON:  07/28/2007.  FINDINGS: Lung bases are unremarkable. Sagittal images of the spine are unremarkable. There is small perihepatic ascites. There is hepatomegaly. The liver measures at least 22 cm in length. There is micro nodular liver contour highly suspicious for chronic liver disease or early cirrhosis.  The study is limited without IV contrast. The patient is status postcholecystectomy.  Atherosclerotic calcifications of abdominal aorta and iliac arteries. Unenhanced pancreas and spleen is unremarkable. The spleen measures 8.9 cm in length.  Adrenal  glands are unremarkable. Unenhanced kidneys are symmetrical in size. There is a probable cyst in midpole of the right kidney measures 2 cm. No nephrolithiasis. No hydronephrosis or hydroureter. No calcified ureteral calculi are noted. Small ascites is noted in right paracolic gutter.  Small pelvic ascites. Oral contrast material was given to the patient. No small bowel obstruction. No colonic obstruction. Contrast material noted all the way in rectosigmoid colon.  There is small left inguinal hernia containing ascites measures about 1.2 cm. There is old fracture deformity of the left inferior pubic ramus.  The uterus is not identified.  No inguinal adenopathy.  IMPRESSION: 1. There is perihepatic ascites. Significant  hepatomegaly. There is micro nodular liver contour. Chronic liver disease or cirrhosis cannot be excluded. Clinical correlation is necessary. 2. The patient is status post cholecystectomy.  No splenomegaly. 3. There is a cyst in midpole of the right kidney measures 2 cm. No nephrolithiasis. No hydronephrosis or hydroureter. 4. Atherosclerotic calcifications of abdominal aorta and iliac arteries. 5. Small pelvic ascites. The uterus is not identified. No small bowel or colonic obstruction.   Electronically Signed   By: Lahoma Crocker M.D.   On: 12/27/2013 18:31   Dg Chest 2 View  12/27/2013   CLINICAL DATA:  Lower abdominal pain and lower extremity swelling  EXAM: CHEST  2 VIEW  COMPARISON:  11/02/2007  FINDINGS: Cardiac shadow is within normal limits. The lungs are hyperaerated bilaterally. No focal infiltrate or sizable effusion is seen.  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   Electronically Signed   By: Inez Catalina M.D.   On: 12/27/2013 16:16   US Abdomen Complete  12/28/2013   CLINICAL DATA:  Elevated liver function tests. Acute renal insufficiency. Ethanol abuse.  EXAM: ULTRASOUND ABDOMEN COMPLETE  COMPARISON:  12/27/2013  FINDINGS: Gallbladder: Surgically absent  Common bile duct: Diameter: Normal, 5 mm.  Liver: Moderate cirrhosis. Heterogeneous echogenicity is likely related to cirrhosis and possibly heterogeneous steatosis.  IVC: No abnormality visualized.  Pancreas: Visualized portion unremarkable.  Spleen: Size and appearance within normal limits.  Right Kidney: Length: 7.4 cm. Atrophic with increased echogenicity. No hydronephrosis. 1.9 cm interpolar cyst.  Left Kidney: Length: 7.8 cm. Atrophic and hyperechoic. 1.4 cm lower pole cyst.  Abdominal aorta: Atherosclerosis throughout the abdominal aorta.  Other findings: Small volume perihepatic ascites.  IMPRESSION: 1. Cirrhosis and small volume ascites. 2. Atrophic kidneys, most consistent with medical renal disease. 3.  Cholecystectomy, without  biliary ductal dilatation.   Electronically Signed   By: Abigail Miyamoto M.D.   On: 12/28/2013 16:12   Scheduled Meds: . cefTRIAXone (ROCEPHIN)  IV  1 g Intravenous Q24H  . dextrose      . folic acid  1 mg Oral Daily  . lactulose  20 g Oral BID  . multivitamin with minerals  1 tablet Oral Daily  . ondansetron  4 mg Intravenous Once  . pantoprazole  40 mg Oral Daily  . thiamine  100 mg Oral Daily   Or  . thiamine  100 mg Intravenous Daily   Continuous Infusions: . dextrose 5 % and 0.9% NaCl 1,000 mL infusion 125 mL/hr at 12/29/13 0800   PRN Meds:.dextrose, loperamide, LORazepam **OR** LORazepam, ondansetron **OR** ondansetron (ZOFRAN) IV, oxyCODONE  ASSESMENT:   *  New diagnoses of cirrhosis.  Discovered during abdominal pain work up.  Has ascites. Not a heavy drinker per her and dtr's recall.  Various labs pending to determine etiology.  Alk phos is elevated (? PBC).  Acute hepatitis  panel negative. ANA, AMA, smooth muscle AB all negative. Ferritin 127.  Pending:  Alpha 1 AT, IgG, ACE  *  Hx PUD and GERD. No PPI PTA.  Previous endoscopic interventions:  EGD 05/2003 by Teena Irani: for UGIB. Duodenitis, oozing at GE Jx: ? MWT.   EGD12/2005  Dr Amedeo Plenty for dysphagia, odynophagia, FOBT +, weight loss: marked duodenitis and severe erosive esophagitis. 08/2005 EGD  Dr Kalman Shan: for melena, CG emesis.  Note mentions pt using ASA and drinking "regularly". Found: Non-bleeding gastric ulcer. Diffuse peptic erythema in the antrum and duodenum. H Pylori negative in 2005. transfused in 2005 for anemia.   *  Normocytic anemia.  S/p PRBC x 2.  Do not see FOBT assay in labs. Low iron and TIBC, low ferritin at 16  *  S/p cholecystectomy.   *  Hypoalbuminemia.   *  ARF.  Medical renal disease/Atrophic kidneys.   *  Hypoglycemia. Receiving d 5. 0.9% NS and go single 50% dextrose infusion.    PLAN   *  Await results of pending tests.  Allow regular diet *  May require liver biopsy.  *  Needs to  get established with a PMD/clinic, as she has no PMD.  *  Given hx of PUD and GER, should be on chronic PPI. Only home med listed was [facility] powders which she should not be using.  *  She will need EGD and colonoscopy at some point, but may be able to defer this to outpt setting. Will order FOBT testing.  *  No overt GI bleeding so does she need the rocephin?  Azucena Freed  12/29/2013, 9:34 AM Pager: [phone]  Attending MD note:   I have taken a history, examined the patient, and reviewed the chart.I have discussed with 2 daughters. I agree with the Advanced Practitioner's impression and recommendations. Alcohol related liver damage very likely or a contributing factor. Massive hepatomegaly,seen in,HCC,amyloidosis, NASH, hemachromatosis, also PBC. INR is OK. I will schedule liver Bx  [facility] Pager # [phone]

## 2013-12-29 NOTE — Evaluation (Signed)
Physical Therapy Evaluation Patient Details Name: KESHANNA RISO MRN: [id number] DOB: [birth year] Today's Date: 12/29/2013   History of Present Illness    This is a 68 year old female with a PMH of cholecystectomy admitted for lower abdominal pain. During the work up of her pain she as identified to have a cirrhotic liver and some mild perihepatic ascites. Her abdominal pain has resolved, but she is admitted for further evaluation of the cirrhosis. She denies any prior history of IVDA, but she does drink socially. On average she will have a couple of drinks per month. There is no known family history of liver disease or Sarcoidosis. She denies any issues with autoimmune disease. Her AP in 2009 was at 64 and how it has elevated into the 900 range. The patient denies taking any type of medications or herbal supplements. Her HBV and HCV serologies are negative.   Clinical Impression  Pt near determined to be at baseline status as reported by patient and daughter. Pt able to safely and independently perform bed mobility and transfers without PT assistance. Pt unable to ambulate for long distances currently due to diarrhea incontinence as a result of her medications. Pt is aware of this problem and refused to ambulate in hall. Pt was safe with short distance ambulation in bedroom but had episode of diarrhea incontinence at end of ambulation. Pt was escorted to bathroom, cleaned and given new gown and new socks once she was done. Pt was left in bed with family present and stated that she did not need any further PT follow up at this time. PT requests that if PT's status changes and she is able to ambulate in the hall, please feel free to re-order PT. No goals will be made due to PT discharge at this time.     Follow Up Recommendations No PT follow up    Equipment Recommendations  None recommended by PT    Recommendations for Other Services       Precautions / Restrictions  Precautions Precautions: Fall Restrictions Weight Bearing Restrictions: No      Mobility  Bed Mobility Overal bed mobility: Independent             General bed mobility comments: Pt able to go from supine to sitting EOB without physical assistance from PT.   Transfers Overall transfer level: Independent Equipment used: None             General transfer comment: Pt able to stand up from lowered bed without physical assistance or cuing from PT. Pt did not require use of UEs to achieve standing.   Ambulation/Gait Ambulation/Gait assistance: Supervision Ambulation Distance (Feet): 20 Feet Assistive device: None Gait Pattern/deviations: Step-through pattern;Decreased stride length;Narrow base of support   Gait velocity interpretation: Below normal speed for age/gender General Gait Details: Pt ambulated from EOB to door in room and back. Pt refused to ambulate outside of room due to diarrhea incontinence from medications. Pt had no loss of balance during ambulation but pt's daughter did state that she was not walking quite like she had prior to being admitted to the hospital. Pt refused to use AD but was able to ambulate safely without it. Pt had epsiode of diarrhea incontinence at the end of ambulation and was escorted to the bathroom.   Stairs            Wheelchair Mobility    Modified Rankin (Stroke Patients Only)       Balance Overall balance assessment: Needs  assistance Sitting-balance support: Feet supported;No upper extremity supported Sitting balance-Leahy Scale: Good Sitting balance - Comments: Pt able to sit EOB and put on her panties without support or assistance from PT.    Standing balance support: During functional activity;No upper extremity supported Standing balance-Leahy Scale: Fair Standing balance comment: Pt able to safely stand without assistance and remain standing without support or assistance.                               Pertinent Vitals/Pain Pain Assessment: No/denies pain    Home Living Family/patient expects to be discharged to:: Private residence Living Arrangements: Children Available Help at Discharge: Family Type of Home: House Home Access: Level entry     Home Layout: One level Home Equipment: None      Prior Function Level of Independence: Independent               Hand Dominance        Extremity/Trunk Assessment               Lower Extremity Assessment: Overall WFL for tasks assessed         Communication   Communication: No difficulties  Cognition Arousal/Alertness: Awake/alert Behavior During Therapy: WFL for tasks assessed/performed Overall Cognitive Status: Within Functional Limits for tasks assessed                      General Comments      Exercises        Assessment/Plan    PT Assessment Patent does not need any further PT services  PT Diagnosis Difficulty walking   PT Problem List    PT Treatment Interventions     PT Goals (Current goals can be found in the Care Plan section)      Frequency     Barriers to discharge        Co-evaluation               End of Session   Activity Tolerance: Patient tolerated treatment well Patient left: in bed;with call bell/phone within reach;with family/visitor present           Time: 8889-1694 PT Time Calculation (min) (ACUTE ONLY): 20 min   Charges:   PT Evaluation $Initial PT Evaluation Tier I: 1 Procedure PT Treatments $Self Care/Home Management: 8-22   PT G CodesJearld Shines 12/29/2013, 4:01 PM

## 2013-12-29 NOTE — Plan of Care (Signed)
Problem: Phase II Progression Outcomes Goal: Obtain order to discontinue catheter if appropriate Outcome: Not Applicable Date Met:  12/29/13     

## 2013-12-29 NOTE — Progress Notes (Signed)
PROGRESS NOTE  [name] OEU:[id number] DOB: Apr 13, 1945 DOA: 12/27/2013 PCP: No primary care provider on file.  68 year old female with a history of alcohol abuse, tobacco use presented with 3 week history of worsening abdominal pain and today history of nausea and vomiting. The patient denied any hematemesis, coffee grounds emesis, hematochezia, melena. The patient continues to drink alcohol, which at this point she states that she only drinks 2 times per month (drinks 4 drinks of whiskey at one sitting). Previously, the patient has been drinking every other day for the past 30-40 years. The patient continues to smoke, having at least 30-pack-year history. She denied any NSAIDs but stated that she used some BC powder 6 days prior to this admission. The patient does not have a primary care provider, and has essentially not seen a physician in approximately 3 years.  Assessment/Plan: Acute on chronic renal failure ( CKD 3) -Likely due to volume depletion -full abdominal ultrasound to also include kidneys--no hydronephrosis -continue IV fluid resuscitation Abdominal pain with vomiting/Tranasminasemia/Ascites -Suspect the patient has degree of esophagitis/gastritis from her alcohol use as well as NSAIDs. -CT abdomen and pelvis showed small amount of perihepatic ascites as well as pelvic ascites with cirrhotic level -tried to schedule paracentesis--not enough ascites to perform -Unfortunately, the patient was given ceftriaxone 12/27/13 which will decrease the yield of her cultures -discontinue antibiotics and observe -Plan to restart antibiotics if the patient becomes unstable clinically -Viral hepatitis serologies negative -appreciate GI followup -ANA, AMA, ASMA negative Leukocytosis -UA--no pyruia -Plan for paracentesis with cell count and cultures -chest x-ray negative for infiltrates -c.diff negative -slight increase today secondary to blood  transfusion -discontinue antibiotics and observe Alcohol abuse -Alcohol withdrawal protocol Thrombocytopenia -Likely due to the patient's chronic alcohol use and cirrhosis of liver -Check HIV antibody--neg -viral hepatitis serologies neg Macrocytic anemia -appreciate GI followup -I have personally performed a rectal examination--> Hemoccult-positive, brown stool, no masses -Transfuse 2 units PRBC Hypoglycemia -am cortisol    Procedures/Studies: Ct Abdomen Pelvis Wo Contrast  12/27/2013   CLINICAL DATA:  Lower abdominal pain for 10 days, status postcholecystectomy  EXAM: CT ABDOMEN AND PELVIS WITHOUT CONTRAST  TECHNIQUE: Multidetector CT imaging of the abdomen and pelvis was performed following the standard protocol without IV contrast.  COMPARISON:  07/28/2007.  FINDINGS: Lung bases are unremarkable. Sagittal images of the spine are unremarkable. There is small perihepatic ascites. There is hepatomegaly. The liver measures at least 22 cm in length. There is micro nodular liver contour highly suspicious for chronic liver disease or early cirrhosis.  The study is limited without IV contrast. The patient is status postcholecystectomy.  Atherosclerotic calcifications of abdominal aorta and iliac arteries. Unenhanced pancreas and spleen is unremarkable. The spleen measures 8.9 cm in length.  Adrenal glands are unremarkable. Unenhanced kidneys are symmetrical in size. There is a probable cyst in midpole of the right kidney measures 2 cm. No nephrolithiasis. No hydronephrosis or hydroureter. No calcified ureteral calculi are noted. Small ascites is noted in right paracolic gutter.  Small pelvic ascites. Oral contrast material was given to the patient. No small bowel obstruction. No colonic obstruction. Contrast material noted all the way in rectosigmoid colon.  There is small left inguinal hernia containing ascites measures about 1.2 cm. There is old fracture deformity of the left inferior pubic ramus.   The uterus is not identified.  No inguinal adenopathy.  IMPRESSION: 1. There is perihepatic ascites. Significant hepatomegaly. There is micro  nodular liver contour. Chronic liver disease or cirrhosis cannot be excluded. Clinical correlation is necessary. 2. The patient is status post cholecystectomy.  No splenomegaly. 3. There is a cyst in midpole of the right kidney measures 2 cm. No nephrolithiasis. No hydronephrosis or hydroureter. 4. Atherosclerotic calcifications of abdominal aorta and iliac arteries. 5. Small pelvic ascites. The uterus is not identified. No small bowel or colonic obstruction.   Electronically Signed   By: Lahoma Crocker M.D.   On: 12/27/2013 18:31   Dg Chest 2 View  12/27/2013   CLINICAL DATA:  Lower abdominal pain and lower extremity swelling  EXAM: CHEST  2 VIEW  COMPARISON:  11/02/2007  FINDINGS: Cardiac shadow is within normal limits. The lungs are hyperaerated bilaterally. No focal infiltrate or sizable effusion is seen.  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   Electronically Signed   By: Inez Catalina M.D.   On: 12/27/2013 16:16   US Abdomen Complete  12/28/2013   CLINICAL DATA:  Elevated liver function tests. Acute renal insufficiency. Ethanol abuse.  EXAM: ULTRASOUND ABDOMEN COMPLETE  COMPARISON:  12/27/2013  FINDINGS: Gallbladder: Surgically absent  Common bile duct: Diameter: Normal, 5 mm.  Liver: Moderate cirrhosis. Heterogeneous echogenicity is likely related to cirrhosis and possibly heterogeneous steatosis.  IVC: No abnormality visualized.  Pancreas: Visualized portion unremarkable.  Spleen: Size and appearance within normal limits.  Right Kidney: Length: 7.4 cm. Atrophic with increased echogenicity. No hydronephrosis. 1.9 cm interpolar cyst.  Left Kidney: Length: 7.8 cm. Atrophic and hyperechoic. 1.4 cm lower pole cyst.  Abdominal aorta: Atherosclerosis throughout the abdominal aorta.  Other findings: Small volume perihepatic ascites.  IMPRESSION: 1. Cirrhosis and small  volume ascites. 2. Atrophic kidneys, most consistent with medical renal disease. 3.  Cholecystectomy, without biliary ductal dilatation.   Electronically Signed   By: Abigail Miyamoto M.D.   On: 12/28/2013 16:12         Subjective: Patient is feeling better today. She is not having any further vomiting, and wants to eat. The patient is actually eating lunch during my examination. She states her abdominal pain has improved. Denies any fevers, chills, chest pain, shortness breath, dysuria, hematuria.  Objective: Filed Vitals:   12/28/13 2100 12/29/13 0149 12/29/13 0500 12/29/13 1333  BP: 96/61 112/61 114/54 99/59  Pulse: 87 89 89 83  Temp: 98.3 F (36.8 C) 98.1 F (36.7 C) 98 F (36.7 C) 97.3 F (36.3 C)  TempSrc:  Oral  Oral  Resp: 12 12 12 18   Height:      Weight:   39.372 kg (86 lb 12.8 oz)   SpO2: 99% 92% 96% 98%    Intake/Output Summary (Last 24 hours) at 12/29/13 1407 Last data filed at 12/28/13 1828  Gross per 24 hour  Intake 410.83 ml  Output     72 ml  Net 338.83 ml   Weight change: -5.987 kg (-13 lb 3.2 oz) Exam:   General:  Pt is alert, follows commands appropriately, not in acute distress  HEENT: No icterus, No thrush, No meningismus, [place]/AT  Cardiovascular: RRR, S1/S2, no rubs, no gallops  Respiratory: final bibasilar crackles. Diminished breath sounds bilateral. No wheezing  Abdomen: Soft/+BS, non tender, non distended, no guarding  Extremities: No edema, No lymphangitis, No petechiae, No rashes, no synovitis  Data Reviewed: Basic Metabolic Panel:  Recent Labs Lab 12/27/13 1250 12/28/13 0737 12/29/13 0457  NA 134* 133* 136*  K 5.2 5.5* 5.7*  CL 95* 98 100  CO2 17* 19 13*  GLUCOSE  147* 69* 40*  BUN 55* 57* 61*  CREATININE 2.52* 2.68* 3.03*  CALCIUM 8.9 8.6 8.8   Liver Function Tests:  Recent Labs Lab 12/27/13 1250 12/28/13 0737 12/29/13 0457  AST 111* 104* 104*  ALT 33 28 29  ALKPHOS 964* 779* 763*  BILITOT 3.1* 3.3* 4.7*  PROT 6.0  5.2* 5.4*  ALBUMIN 1.6* 1.4* 1.4*    Recent Labs Lab 12/27/13 1250  LIPASE 57    Recent Labs Lab 12/27/13 2123  AMMONIA 80*   CBC:  Recent Labs Lab 12/27/13 1250 12/27/13 2122 12/28/13 0737 12/29/13 0457  WBC 16.5* 16.3* 13.9* 15.8*  NEUTROABS 13.6* 13.5*  --   --   HGB 7.2* 7.2* 6.8* 11.8*  HCT 23.8* 23.2* 21.0* 36.0  MCV 100.0 97.1 97.7 95.7  PLT 67* 73* 67* 78*   Cardiac Enzymes: No results for input(s): CKTOTAL, CKMB, CKMBINDEX, TROPONINI in the last 168 hours. BNP: Invalid input(s): POCBNP CBG:  Recent Labs Lab 12/29/13 0747  GLUCAP 143*    Recent Results (from the past 240 hour(s))  Clostridium Difficile by PCR     Status: None   Collection Time: 12/28/13 12:50 PM  Result Value Ref Range Status   C difficile by pcr NEGATIVE NEGATIVE Final     Scheduled Meds: . dextrose      . folic acid  1 mg Oral Daily  . lactulose  20 g Oral BID  . multivitamin with minerals  1 tablet Oral Daily  . ondansetron  4 mg Intravenous Once  . pantoprazole  40 mg Oral Daily  . thiamine  100 mg Oral Daily   Or  . thiamine  100 mg Intravenous Daily   Continuous Infusions: . dextrose 5 % and 0.9% NaCl 1,000 mL infusion 125 mL/hr at 12/29/13 0800     Zayvion Stailey, DO  [facility] Pager [phone]  If 7PM-7AM, please contact night-coverage [email or web address] Password [email or web address] 12/29/2013, 2:07 PM   LOS: 2 days

## 2013-12-29 NOTE — Care Management Note (Addendum)
    Page 1 of 1   01/01/2014     10:45:17 AM CARE MANAGEMENT NOTE 01/01/2014  Patient:  [name], [name]   Account Number:  [id number]  Date Initiated:  12/29/2013  Documentation initiated by:  McNally,Kristin  Subjective/Objective Assessment:   pt admit with acute on chronic aki/cirrhosis     Action/Plan:   from home with dtr  pt eval- no pt f/u.   Anticipated DC Date:  01/01/2014   Anticipated DC Plan:  [place]  CM consult  Follow-up appt scheduled      Choice offered to / List presented to:             Status of service:  Completed, signed off Medicare Important Message given?  YES (If response is "NO", the following Medicare IM given date fields will be blank) Date Medicare IM given:  12/29/2013 Medicare IM given by:  McNally,Kristin Date Additional Medicare IM given:  01/01/2014 Additional Medicare IM given by:  [facility]  Discharge Disposition:  HOME/SELF CARE  Per UR Regulation:  Reviewed for med. necessity/level of care/duration of stay  If discussed at [place] of Stay Meetings, dates discussed:    Comments:  01/01/14 [place], BSN [phone] patient is for dc today, apt scheduled at [facility] for 11/23 at 11 am, paperwork given to patient.  12/31/13 [place], BSN [phone] patient is for dc home today, no needs anticipated.  12/29/13 kristin mcnally Pt from home with dtr. admit with cirrhosis/aki. Will follow for dc needs. Anticipated dc home with dtr. IM given 12/29/13.

## 2013-12-29 NOTE — Progress Notes (Signed)
IM given 12/29/13 by Cephas Revard rn 

## 2013-12-29 NOTE — Progress Notes (Addendum)
Hypoglycemic Event  Serum Glucose: 40  Treatment: D50 IV 25 mL  Symptoms: None  Follow-up CBG: Time:0743 CBG Result 143:  Possible Reasons for Event: Other: npo status  Comments/MD notified:Tat orders received    Karolee Stamps L  Remember to initiate Hypoglycemia Order Set & complete

## 2013-12-30 ENCOUNTER — Encounter (HOSPITAL_COMMUNITY): Payer: Self-pay | Admitting: Radiology

## 2013-12-30 ENCOUNTER — Inpatient Hospital Stay (HOSPITAL_COMMUNITY): Payer: Medicare Other

## 2013-12-30 DIAGNOSIS — R160 Hepatomegaly, not elsewhere classified: Secondary | ICD-10-CM | POA: Insufficient documentation

## 2013-12-30 LAB — COMPREHENSIVE METABOLIC PANEL
ALBUMIN: 1.2 g/dL — AB (ref 3.5–5.2)
ALK PHOS: 636 U/L — AB (ref 39–117)
ALT: 24 U/L (ref 0–35)
ANION GAP: 20 — AB (ref 5–15)
AST: 80 U/L — AB (ref 0–37)
BILIRUBIN TOTAL: 2.8 mg/dL — AB (ref 0.3–1.2)
BUN: 55 mg/dL — AB (ref 6–23)
CHLORIDE: 105 meq/L (ref 96–112)
CO2: 14 meq/L — AB (ref 19–32)
CREATININE: 3.23 mg/dL — AB (ref 0.50–1.10)
Calcium: 8.1 mg/dL — ABNORMAL LOW (ref 8.4–10.5)
GFR calc Af Amer: 16 mL/min — ABNORMAL LOW (ref >90)
GFR, EST NON AFRICAN AMERICAN: 14 mL/min — AB (ref >90)
Glucose, Bld: 127 mg/dL — ABNORMAL HIGH (ref 70–99)
POTASSIUM: 4 meq/L (ref 3.7–5.3)
Sodium: 139 mEq/L (ref 137–147)
Total Protein: 4.8 g/dL — ABNORMAL LOW (ref 6.0–8.3)

## 2013-12-30 LAB — URINE CULTURE
COLONY COUNT: NO GROWTH
Culture: NO GROWTH

## 2013-12-30 LAB — CBC WITH DIFFERENTIAL/PLATELET
BASOS ABS: 0.1 10*3/uL (ref 0.0–0.1)
BASOS PCT: 0 % (ref 0–1)
EOS PCT: 2 % (ref 0–5)
Eosinophils Absolute: 0.3 10*3/uL (ref 0.0–0.7)
HEMATOCRIT: 32.1 % — AB (ref 36.0–46.0)
Hemoglobin: 10.4 g/dL — ABNORMAL LOW (ref 12.0–15.0)
Lymphocytes Relative: 5 % — ABNORMAL LOW (ref 12–46)
Lymphs Abs: 0.8 10*3/uL (ref 0.7–4.0)
MCH: 30.5 pg (ref 26.0–34.0)
MCHC: 32.4 g/dL (ref 30.0–36.0)
MCV: 94.1 fL (ref 78.0–100.0)
MONO ABS: 1.2 10*3/uL — AB (ref 0.1–1.0)
Monocytes Relative: 8 % (ref 3–12)
Neutro Abs: 13.1 10*3/uL — ABNORMAL HIGH (ref 1.7–7.7)
Neutrophils Relative %: 85 % — ABNORMAL HIGH (ref 43–77)
PLATELETS: 70 10*3/uL — AB (ref 150–400)
RBC: 3.41 MIL/uL — ABNORMAL LOW (ref 3.87–5.11)
RDW: 20.1 % — AB (ref 11.5–15.5)
WBC: 15.5 10*3/uL — AB (ref 4.0–10.5)

## 2013-12-30 LAB — URINALYSIS, ROUTINE W REFLEX MICROSCOPIC
Glucose, UA: NEGATIVE mg/dL
Hgb urine dipstick: NEGATIVE
Ketones, ur: NEGATIVE mg/dL
Nitrite: NEGATIVE
PROTEIN: NEGATIVE mg/dL
Specific Gravity, Urine: 1.02 (ref 1.005–1.030)
Urobilinogen, UA: 0.2 mg/dL (ref 0.0–1.0)
pH: 5 (ref 5.0–8.0)

## 2013-12-30 LAB — URINE MICROSCOPIC-ADD ON

## 2013-12-30 LAB — IGG: IgG (Immunoglobin G), Serum: 1070 mg/dL (ref 690–1700)

## 2013-12-30 LAB — GLUCOSE, CAPILLARY: Glucose-Capillary: 107 mg/dL — ABNORMAL HIGH (ref 70–99)

## 2013-12-30 LAB — CREATININE, URINE, RANDOM: Creatinine, Urine: 161.55 mg/dL

## 2013-12-30 LAB — SODIUM, URINE, RANDOM: Sodium, Ur: <20 mEq/L

## 2013-12-30 MED ORDER — MIDAZOLAM HCL 2 MG/2ML IJ SOLN
INTRAMUSCULAR | Status: AC
  Filled 2013-12-30: qty 2

## 2013-12-30 MED ORDER — FENTANYL CITRATE 0.05 MG/ML IJ SOLN
INTRAMUSCULAR | Status: AC | PRN
  Administered 2013-12-30 (×2): 25 ug via INTRAVENOUS

## 2013-12-30 MED ORDER — FENTANYL CITRATE 0.05 MG/ML IJ SOLN
INTRAMUSCULAR | Status: AC
  Filled 2013-12-30: qty 2

## 2013-12-30 MED ORDER — LIDOCAINE HCL 1 % IJ SOLN
INTRAMUSCULAR | Status: AC
  Filled 2013-12-30: qty 20

## 2013-12-30 MED ORDER — BENZONATATE 100 MG PO CAPS
100.0000 mg | ORAL_CAPSULE | Freq: Three times a day (TID) | ORAL | Status: DC
  Administered 2013-12-30 – 2014-01-01 (×6): 100 mg via ORAL
  Filled 2013-12-30 (×9): qty 1

## 2013-12-30 MED ORDER — IOHEXOL 300 MG/ML  SOLN
50.0000 mL | Freq: Once | INTRAMUSCULAR | Status: AC | PRN
  Administered 2013-12-30: 1 mL via INTRAVENOUS

## 2013-12-30 MED ORDER — SODIUM CHLORIDE 0.9 % IV SOLN
INTRAVENOUS | Status: AC | PRN
  Administered 2013-12-30: 10 mL/h via INTRAVENOUS

## 2013-12-30 MED ORDER — SODIUM BICARBONATE 8.4 % IV SOLN
INTRAVENOUS | Status: AC
  Administered 2013-12-30 – 2013-12-31 (×2): via INTRAVENOUS
  Filled 2013-12-30 (×2): qty 150

## 2013-12-30 MED ORDER — MIDAZOLAM HCL 2 MG/2ML IJ SOLN
INTRAMUSCULAR | Status: AC | PRN
  Administered 2013-12-30 (×2): 0.5 mg via INTRAVENOUS

## 2013-12-30 NOTE — Sedation Documentation (Signed)
Dressing to right neck, C/D/I at this time.  No hematoma or bleeding at this time.

## 2013-12-30 NOTE — Progress Notes (Signed)
Verbal order from Dr. Carles Collet to resume diet when back from procedure. Also order to d/c IV fluids if CBG >70.

## 2013-12-30 NOTE — Procedures (Signed)
Technically successful transjugular liver biopsy. No immediate post procedural complications.

## 2013-12-30 NOTE — Plan of Care (Signed)
Problem: Phase III Progression Outcomes Goal: Pain controlled on oral analgesia Outcome: Completed/Met Date Met:  12/30/13 Goal: Activity at appropriate level-compared to baseline (UP IN CHAIR FOR HEMODIALYSIS)  Outcome: Completed/Met Date Met:  12/30/13 Goal: IV/normal saline lock discontinued Outcome: Progressing IV fluids infusing Goal: Foley discontinued Outcome: Not Applicable Date Met:  56/86/16

## 2013-12-30 NOTE — Consult Note (Addendum)
Reason for Consult: Acute renal failure-possibly on chronic kidney disease stage III Referring Physician: Orson Eva MD [facility])  HPI:  68 year old African-American woman with past medical history significant only for cholecystectomy/left ankle ORIF. She does not have a regular medical provider and does not know whether she has any other chronic medical problems. From data available on Epic, it is noted that in 2014, her creatinine was 1.9 (GFR 33). She presented 3 days ago with abdominal pain, nausea and vomiting with decreasing appetite that preceded this. She also reported some increased abdominal girth and firmness.  She denies any preceding urinary symptoms including dysuria, urgency, frequency, flank pain or hematuria. She denies any personal history of renal disease (although she doesn't follow up with a provider as stated above), personal or familial history of autoimmune disorders or familial history of dialysis. She reports sporadic use of over-the-counter nonsteroidals-usually BC powders about once a week when she gets headaches/back pain. She denies any significant pedal edema.  When she presented to the emergency room, she was suspected to be volume depleted and intravenous fluids started at a slow rate however, creatinine has continued to rise as tabulated below. Renal ultrasound shows bilaterally atrophic kidneys (right 7.4, left 7.8 cm) with increased echogenicity.   02/15/2012  12/27/2013  12/28/2013  12/29/2013  12/30/2013   BUN 31 (H) 55 (H) 57 (H) 61 (H) 55 (H)  Creatinine 1.90 (H) 2.52 (H) 2.68 (H) 3.03 (H) 3.23 (H)   Earlier today, she had a transjugular liver biopsy for further evaluation of what appears to be a cirrhotic liver-likely from alcohol use.  Past Medical History  Diagnosis Date  . Cirrhosis 12/2013    Past Surgical History  Procedure Laterality Date  . Cholecystectomy    . Orif ankle fracture bimalleolar Left 2003    History reviewed. No pertinent family  history.  Social History:  reports that she has been smoking Cigarettes.  She has a 50 pack-year smoking history. She does not have any smokeless tobacco history on file. She reports that she drinks alcohol. She reports that she does not use illicit drugs.  Allergies:  Allergies  Allergen Reactions  . Percocet [Oxycodone-Acetaminophen] Nausea Only  . Vicodin [Hydrocodone-Acetaminophen] Nausea Only    Medications:  Scheduled: . fentaNYL      . folic acid  1 mg Oral Daily  . lactulose  20 g Oral BID  . lidocaine      . midazolam      . multivitamin with minerals  1 tablet Oral Daily  . ondansetron  4 mg Intravenous Once  . pantoprazole  40 mg Oral Daily  . thiamine  100 mg Oral Daily   Or  . thiamine  100 mg Intravenous Daily    Results for orders placed or performed during the hospital encounter of 12/27/13 (from the past 48 hour(s))  Clostridium Difficile by PCR     Status: None   Collection Time: 12/28/13 12:50 PM  Result Value Ref Range   C difficile by pcr NEGATIVE NEGATIVE  Urinalysis with microscopic     Status: Abnormal   Collection Time: 12/28/13  5:01 PM  Result Value Ref Range   Color, Urine ORANGE (A) YELLOW    Comment: BIOCHEMICALS MAY BE AFFECTED BY COLOR   APPearance CLEAR CLEAR   Specific Gravity, Urine 1.018 1.005 - 1.030   pH 5.5 5.0 - 8.0   Glucose, UA NEGATIVE NEGATIVE mg/dL   Hgb urine dipstick NEGATIVE NEGATIVE   Bilirubin Urine MODERATE (A)  NEGATIVE   Ketones, ur 15 (A) NEGATIVE mg/dL   Protein, ur NEGATIVE NEGATIVE mg/dL   Urobilinogen, UA 1.0 0.0 - 1.0 mg/dL   Nitrite NEGATIVE NEGATIVE   Leukocytes, UA TRACE (A) NEGATIVE  Urine culture     Status: None   Collection Time: 12/28/13  5:01 PM  Result Value Ref Range   Specimen Description URINE, CATHETERIZED    Special Requests NONE    Culture  Setup Time      12/29/2013 01:11 Performed at [facility] Performed at [facility]     Culture NO  GROWTH Performed at [facility]     Report Status 12/30/2013 FINAL   Urine rapid drug screen (hosp performed)     Status: None   Collection Time: 12/28/13  5:01 PM  Result Value Ref Range   Opiates NONE DETECTED NONE DETECTED   Cocaine NONE DETECTED NONE DETECTED   Benzodiazepines NONE DETECTED NONE DETECTED   Amphetamines NONE DETECTED NONE DETECTED   Tetrahydrocannabinol NONE DETECTED NONE DETECTED   Barbiturates NONE DETECTED NONE DETECTED    Comment:        DRUG SCREEN FOR MEDICAL PURPOSES ONLY.  IF CONFIRMATION IS NEEDED FOR ANY PURPOSE, NOTIFY LAB WITHIN 5 DAYS.        LOWEST DETECTABLE LIMITS FOR URINE DRUG SCREEN Drug Class       Cutoff (ng/mL) Amphetamine      1000 Barbiturate      200 Benzodiazepine   465 Tricyclics       035 Opiates          300 Cocaine          300 THC              50   Urine microscopic-add on     Status: Abnormal   Collection Time: 12/28/13  5:01 PM  Result Value Ref Range   Squamous Epithelial / LPF FEW (A) RARE   WBC, UA 0-2 <3 WBC/hpf  Comprehensive metabolic panel     Status: Abnormal   Collection Time: 12/29/13  4:57 AM  Result Value Ref Range   Sodium 136 (L) 137 - 147 mEq/L   Potassium 5.7 (H) 3.7 - 5.3 mEq/L   Chloride 100 96 - 112 mEq/L   CO2 13 (L) 19 - 32 mEq/L   Glucose, Bld 40 (LL) 70 - 99 mg/dL    Comment: CRITICAL RESULT CALLED TO, READ BACK BY AND VERIFIED WITH: V.CONLEY,RN 12/29/13 0722 BY BSLADE    BUN 61 (H) 6 - 23 mg/dL   Creatinine, Ser 3.03 (H) 0.50 - 1.10 mg/dL   Calcium 8.8 8.4 - 10.5 mg/dL   Total Protein 5.4 (L) 6.0 - 8.3 g/dL   Albumin 1.4 (L) 3.5 - 5.2 g/dL   AST 104 (H) 0 - 37 U/L    Comment: HEMOLYSIS AT THIS LEVEL MAY AFFECT RESULT   ALT 29 0 - 35 U/L   Alkaline Phosphatase 763 (H) 39 - 117 U/L   Total Bilirubin 4.7 (H) 0.3 - 1.2 mg/dL   GFR calc non Af Amer 15 (L) >90 mL/min   GFR calc Af Amer 17 (L) >90 mL/min    Comment: (NOTE) The eGFR has been calculated using the CKD EPI  equation. This calculation has not been validated in all clinical situations. eGFR's persistently <90 mL/min signify possible Chronic Kidney Disease.    Anion gap 23 (H) 5 - 15  CBC  Status: Abnormal   Collection Time: 12/29/13  4:57 AM  Result Value Ref Range   WBC 15.8 (H) 4.0 - 10.5 K/uL    Comment: REPEATED TO VERIFY   RBC 3.76 (L) 3.87 - 5.11 MIL/uL   Hemoglobin 11.8 (L) 12.0 - 15.0 g/dL    Comment: REPEATED TO VERIFY POST TRANSFUSION SPECIMEN    HCT 36.0 36.0 - 46.0 %   MCV 95.7 78.0 - 100.0 fL   MCH 31.4 26.0 - 34.0 pg   MCHC 32.8 30.0 - 36.0 g/dL   RDW 20.5 (H) 11.5 - 15.5 %   Platelets 78 (L) 150 - 400 K/uL    Comment: PLATELET COUNT CONFIRMED BY SMEAR  Glucose, capillary     Status: Abnormal   Collection Time: 12/29/13  7:47 AM  Result Value Ref Range   Glucose-Capillary 143 (H) 70 - 99 mg/dL  Occult blood card to lab, stool     Status: None   Collection Time: 12/29/13  4:01 PM  Result Value Ref Range   Fecal Occult Bld NEGATIVE NEGATIVE  Comprehensive metabolic panel     Status: Abnormal   Collection Time: 12/30/13  5:33 AM  Result Value Ref Range   Sodium 139 137 - 147 mEq/L   Potassium 4.0 3.7 - 5.3 mEq/L    Comment: DELTA CHECK NOTED   Chloride 105 96 - 112 mEq/L   CO2 14 (L) 19 - 32 mEq/L   Glucose, Bld 127 (H) 70 - 99 mg/dL   BUN 55 (H) 6 - 23 mg/dL   Creatinine, Ser 3.23 (H) 0.50 - 1.10 mg/dL   Calcium 8.1 (L) 8.4 - 10.5 mg/dL   Total Protein 4.8 (L) 6.0 - 8.3 g/dL   Albumin 1.2 (L) 3.5 - 5.2 g/dL   AST 80 (H) 0 - 37 U/L   ALT 24 0 - 35 U/L   Alkaline Phosphatase 636 (H) 39 - 117 U/L   Total Bilirubin 2.8 (H) 0.3 - 1.2 mg/dL   GFR calc non Af Amer 14 (L) >90 mL/min   GFR calc Af Amer 16 (L) >90 mL/min    Comment: (NOTE) The eGFR has been calculated using the CKD EPI equation. This calculation has not been validated in all clinical situations. eGFR's persistently <90 mL/min signify possible Chronic Kidney Disease.    Anion gap 20 (H) 5 - 15   CBC with Differential     Status: Abnormal   Collection Time: 12/30/13  5:33 AM  Result Value Ref Range   WBC 15.5 (H) 4.0 - 10.5 K/uL   RBC 3.41 (L) 3.87 - 5.11 MIL/uL   Hemoglobin 10.4 (L) 12.0 - 15.0 g/dL   HCT 32.1 (L) 36.0 - 46.0 %   MCV 94.1 78.0 - 100.0 fL   MCH 30.5 26.0 - 34.0 pg   MCHC 32.4 30.0 - 36.0 g/dL   RDW 20.1 (H) 11.5 - 15.5 %   Platelets 70 (L) 150 - 400 K/uL    Comment: PLATELET COUNT CONFIRMED BY SMEAR CONSISTENT WITH PREVIOUS RESULT    Neutrophils Relative % 85 (H) 43 - 77 %   Neutro Abs 13.1 (H) 1.7 - 7.7 K/uL   Lymphocytes Relative 5 (L) 12 - 46 %   Lymphs Abs 0.8 0.7 - 4.0 K/uL   Monocytes Relative 8 3 - 12 %   Monocytes Absolute 1.2 (H) 0.1 - 1.0 K/uL   Eosinophils Relative 2 0 - 5 %   Eosinophils Absolute 0.3 0.0 - 0.7 K/uL  Basophils Relative 0 0 - 1 %   Basophils Absolute 0.1 0.0 - 0.1 K/uL  Glucose, capillary     Status: Abnormal   Collection Time: 12/30/13 12:17 PM  Result Value Ref Range   Glucose-Capillary 107 (H) 70 - 99 mg/dL    US Abdomen Complete  12/28/2013   CLINICAL DATA:  Elevated liver function tests. Acute renal insufficiency. Ethanol abuse.  EXAM: ULTRASOUND ABDOMEN COMPLETE  COMPARISON:  12/27/2013  FINDINGS: Gallbladder: Surgically absent  Common bile duct: Diameter: Normal, 5 mm.  Liver: Moderate cirrhosis. Heterogeneous echogenicity is likely related to cirrhosis and possibly heterogeneous steatosis.  IVC: No abnormality visualized.  Pancreas: Visualized portion unremarkable.  Spleen: Size and appearance within normal limits.  Right Kidney: Length: 7.4 cm. Atrophic with increased echogenicity. No hydronephrosis. 1.9 cm interpolar cyst.  Left Kidney: Length: 7.8 cm. Atrophic and hyperechoic. 1.4 cm lower pole cyst.  Abdominal aorta: Atherosclerosis throughout the abdominal aorta.  Other findings: Small volume perihepatic ascites.  IMPRESSION: 1. Cirrhosis and small volume ascites. 2. Atrophic kidneys, most consistent with medical  renal disease. 3.  Cholecystectomy, without biliary ductal dilatation.   Electronically Signed   By: Abigail Miyamoto M.D.   On: 12/28/2013 16:12    Review of Systems  Constitutional: Negative.   HENT: Negative for congestion, hearing loss, nosebleeds and tinnitus.   Eyes: Negative.   Respiratory: Positive for cough and shortness of breath.        Reports shortness of breath when she lays flat-due to abdominal distention  Cardiovascular: Negative.   Gastrointestinal:       See history of present illness  Genitourinary: Negative.   Musculoskeletal: Positive for back pain. Negative for myalgias and neck pain.  Skin: Negative.   Neurological: Positive for headaches. Negative for dizziness, tingling and tremors.  Psychiatric/Behavioral: Positive for substance abuse. Negative for depression. The patient is not nervous/anxious.    Blood pressure 117/60, pulse 98, temperature 97.3 F (36.3 C), temperature source Axillary, resp. rate 15, height _0  (1.651 m), weight 46 kg (101 lb 6.6 oz), SpO2 99 %. Physical Exam  Nursing note and vitals reviewed. Constitutional: She is oriented to person, place, and time. She appears well-developed. No distress.  Appears cachectic  HENT:  Head: Normocephalic and atraumatic.  Nose: Nose normal.  Dry oral mucosa/oropharynx  Eyes: EOM are normal. Pupils are equal, round, and reactive to light. No scleral icterus.  Neck: Normal range of motion. Neck supple. No JVD present. No thyromegaly present.  Cardiovascular: Normal rate, regular rhythm and normal heart sounds.  Exam reveals no friction rub.   No murmur heard. Respiratory: Effort normal and breath sounds normal. No respiratory distress. She has no wheezes. She has no rales.  GI: Soft. She exhibits distension. There is tenderness. There is no rebound.  Musculoskeletal: Normal range of motion. She exhibits no edema or tenderness.  Neurological: She is alert and oriented to person, place, and time.  Skin:  Skin is warm and dry. No erythema.  Psychiatric: She has a normal mood and affect.    Assessment/Plan: 1. 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 Her acute renal failure appears to be primarily from intravascular volume contraction given her physical exam. She does not have a significant amount of ascites noted on physical exam/radiological evaluation. My plan is to supplement her oral intake with intravenous fluids for  the next 18 hours and monitor renal function. The slow rise of her creatinine may suggest an approach of the plateau phase of ATN. No acute electrolyte abnormalities noted at this time-previously she was hyperkalemic. I will repeat her urinalysis/urine electrolytes-do not feel that this is HRS. Needs strict I&Os or will need a foley catheter 2. Anion gap metabolic acidosis: Likely secondary from acute renal failure, will treat with intravenous isotonic sodium bicarbonate. 3. Cirrhosis: Ongoing gastroenterology workup and now status post transjugular hepatic biopsy. Suspected to be from alcohol use. 4. Anemia: Possibly associated with preceding alcohol use, she does have low iron stores and would benefit from intravenous iron therapy. 5. Cough: evidence of COPD on chest x-ray, will give trial of Tessalon Perles as her coughing worsens abdominal pain.  Jorja Empie K. 12/30/2013, 12:26 PM

## 2013-12-30 NOTE — Progress Notes (Signed)
Daily Rounding Note  12/30/2013, 8:55 AM  LOS: 3 days   SUBJECTIVE:       Some nausea with dinner last night, no vomiting.  No nausea today.  Is NPO for liver bx.  Had several BMs yesterday, resulting from doses of lactulose which was held last night. .   OBJECTIVE:         Vital signs in last 24 hours:    Temp:  [97.3 F (36.3 C)-98.9 F (37.2 C)] 98.6 F (37 C) (11/17 0553) Pulse Rate:  [83-93] 93 (11/17 0553) Resp:  [16-18] 18 (11/17 0553) BP: (94-102)/(52-59) 94/56 mmHg (11/17 0553) SpO2:  [97 %-100 %] 100 % (11/17 0553) Weight:  [101 lb 6.6 oz (46 kg)] 101 lb 6.6 oz (46 kg) (11/17 0553) Last BM Date: 12/29/13 Filed Weights   12/28/13 0505 12/29/13 0500 12/30/13 0553  Weight: 100 lb 11.2 oz (45.677 kg) 86 lb 12.8 oz (39.372 kg) 101 lb 6.6 oz (46 kg)   General:  Cachectic, chronically ill looking.  Comfortable and arouses easily Heart: RRR Chest: clear bil.  Abdomen: soft, thin, hepatomegaly, not tender.  Active BS  Extremities: no CCE.  Thin arms and legs with muscle wastng Neuro/Psych:  Cooperative, affect depressed, appropriate and alert x 3.  No focal deficits.   Intake/Output from previous day:    Intake/Output this shift: Total I/O In: 125 [I.V.:125] Out: -   Lab Results:  Recent Labs  12/28/13 0737 12/29/13 0457 12/30/13 0533  WBC 13.9* 15.8* 15.5*  HGB 6.8* 11.8* 10.4*  HCT 21.0* 36.0 32.1*  PLT 67* 78* 70*   BMET  Recent Labs  12/28/13 0737 12/29/13 0457 12/30/13 0533  NA 133* 136* 139  K 5.5* 5.7* 4.0  CL 98 100 105  CO2 19 13* 14*  GLUCOSE 69* 40* 127*  BUN 57* 61* 55*  CREATININE 2.68* 3.03* 3.23*  CALCIUM 8.6 8.8 8.1*   LFT  Recent Labs  12/27/13 2122 12/28/13 0737 12/29/13 0457 12/30/13 0533  PROT  --  5.2* 5.4* 4.8*  ALBUMIN  --  1.4* 1.4* 1.2*  AST  --  104* 104* 80*  ALT  --  _0 ALKPHOS  --  779* 763* 636*  BILITOT  --  3.3* 4.7* 2.8*  BILIDIR 3.0*   --   --   --    PT/INR  Recent Labs  12/27/13 2122  LABPROT 17.3*  INR 1.40   Hepatitis Panel  Recent Labs  12/27/13 2122  HEPBSAG NEGATIVE  HCVAB NEGATIVE  HEPAIGM NON REACTIVE  HEPBIGM NON REACTIVE    Studies/Results: US Abdomen Complete  12/28/2013   CLINICAL DATA:  Elevated liver function tests. Acute renal insufficiency. Ethanol abuse.  EXAM: ULTRASOUND ABDOMEN COMPLETE  COMPARISON:  12/27/2013  FINDINGS: Gallbladder: Surgically absent  Common bile duct: Diameter: Normal, 5 mm.  Liver: Moderate cirrhosis. Heterogeneous echogenicity is likely related to cirrhosis and possibly heterogeneous steatosis.  IVC: No abnormality visualized.  Pancreas: Visualized portion unremarkable.  Spleen: Size and appearance within normal limits.  Right Kidney: Length: 7.4 cm. Atrophic with increased echogenicity. No hydronephrosis. 1.9 cm interpolar cyst.  Left Kidney: Length: 7.8 cm. Atrophic and hyperechoic. 1.4 cm lower pole cyst.  Abdominal aorta: Atherosclerosis throughout the abdominal aorta.  Other findings: Small volume perihepatic ascites.  IMPRESSION: 1. Cirrhosis and small volume ascites. 2. Atrophic kidneys, most consistent with medical renal disease. 3.  Cholecystectomy, without biliary ductal dilatation.   Electronically  Signed   By: Abigail Miyamoto M.D.   On: 12/28/2013 16:12   Scheduled Meds: . folic acid  1 mg Oral Daily  . lactulose  20 g Oral BID  . multivitamin with minerals  1 tablet Oral Daily  . ondansetron  4 mg Intravenous Once  . pantoprazole  40 mg Oral Daily  . thiamine  100 mg Oral Daily   Or  . thiamine  100 mg Intravenous Daily   Continuous Infusions: . dextrose 5 % and 0.9% NaCl 125 mL/hr at 12/30/13 0514   PRN Meds:.loperamide, LORazepam **OR** LORazepam, ondansetron **OR** ondansetron (ZOFRAN) IV, oxyCODONE  ASSESMENT:   * New diagnoses of cirrhosis. Discovered during abdominal pain work up. Small volume ascites. Not a heavy drinker per pt and dtr's  recall. However at most labs she has AST > ALT which suggests ETOH abuse. Ammonia level 80, treated with Lactulose.  Alk phos is elevated (? PBC). Acute hepatitis panel negative. ANA, AMA, smooth muscle AB all negative. Ferritin 127. IgG 1070 (normal range).  ACE 77 (normal is 8-52) Pending: Alpha 1 AT  Liver biopsy ordered, to be done today.   * Hx PUD and GERD. No PPI PTA.  Previous endoscopic interventions:  EGD 05/2003 by Teena Irani: for UGIB. Duodenitis, oozing at GE Jx: ? MWT.  EGD12/2005 Dr Amedeo Plenty for dysphagia, odynophagia, FOBT +, weight loss: marked duodenitis and severe erosive esophagitis. 08/2005 EGD Dr Kalman Shan: for melena, CG emesis. Note mentions pt using ASA and drinking "regularly". Found: Non-bleeding gastric ulcer. Diffuse peptic erythema in the antrum and duodenum. H Pylori negative in 2005.  On PPI.   * Normocytic anemia. S/p PRBC x 2. FOBT negative 11/16. Low iron and TIBC, low ferritin at 16.  Transfused in 2005 for anemia.  *  Thrombocytopenia.  Stable. Present as far back as 2009.   * S/p cholecystectomy.   * Hypoalbuminemia, malnutrition, cachexia.   * Acute on chronic renal failure. Medical renal disease/Atrophic kidneys.    PLAN   *  Await liver biopsy.  Resume diet after this completed.     Azucena Freed  12/30/2013, 8:55 AM Pager: [phone] Attending MD note:   I have taken a history,  and reviewed the chart. I agree with the Advanced Practitioner's impression and recommendations. I have spoken to IR with regards to scheduling liver Bx percutaneous  vs transjugular.  [facility] Pager # [phone]

## 2013-12-30 NOTE — H&P (Addendum)
Reason for Consult: Cirrhosis Chief Complaint: Chief Complaint  Patient presents with  . Abdominal Pain  . Shortness of Breath   Referring Physician(s): (GI) Dr. Olevia Perches  History of Present Illness: [name] is a 68 y.o. female who presented c/o lower abdominal pain, N/V on 11/14, imaging revealed ascites, hepatomegaly and nodular liver contour suspicious for cirrhosis. The patient states her lower abdominal pain has resolved and she denies any further episodes of vomiting. IR received request for liver biopsy given new findings and concern for cirrhosis. The patient does admit to alcohol use, she denies any active bleeding. She denies any active chest pain, shortness of breath or palpitations.  She denies any known complications to sedation. GI has evaluated and will plan on EGD as outpatient to screen for varices.   Past Medical History  Diagnosis Date  . Cirrhosis 12/2013    Past Surgical History  Procedure Laterality Date  . Cholecystectomy    . Orif ankle fracture bimalleolar Left 2003    Allergies: Percocet and Vicodin  Medications: Prior to Admission medications   Medication Sig Start Date End Date Taking? Authorizing Provider  Aspirin-Salicylamide-Caffeine (BC FAST PAIN RELIEF) [phone] MG PACK Take 1 Package by mouth daily as needed (for pain).   Yes Historical Provider, MD    History reviewed. No pertinent family history.  History   Social History  . Marital Status: Widowed    Spouse Name: N/A    Number of Children: N/A  . Years of Education: N/A   Social History Main Topics  . Smoking status: Current Every Day Smoker -- 1.00 packs/day for 50 years    Types: Cigarettes  . Smokeless tobacco: None  . Alcohol Use: Yes  . Drug Use: No  . Sexual Activity: None   Other Topics Concern  . None   Social History Narrative   Review of Systems: A 12 point ROS discussed and pertinent positives are indicated in the HPI above.  All other systems  are negative.  Review of Systems  Vital Signs: BP 94/56 mmHg  Pulse 93  Temp(Src) 98.6 F (37 C) (Oral)  Resp 18  Ht 5\' 5"  (1.651 m)  Wt 101 lb 6.6 oz (46 kg)  BMI 16.88 kg/m2  SpO2 100%  Physical Exam  Constitutional: She is oriented to person, place, and time. No distress.  HENT:  Head: Normocephalic and atraumatic.  Neck: No tracheal deviation present.  Cardiovascular: Normal rate and regular rhythm.  Exam reveals no gallop and no friction rub.   No murmur heard. Pulmonary/Chest: No respiratory distress. She has no wheezes. She has no rales.  Right BS diminished, left CTA  Abdominal: Soft. Bowel sounds are normal. She exhibits distension. There is tenderness.  Hepatomegaly, RUQ and epigastric tenderness TP  Neurological: She is alert and oriented to person, place, and time.  Skin: She is not diaphoretic.  Psychiatric: She has a normal mood and affect. Her behavior is normal. Thought content normal.   Imaging: Ct Abdomen Pelvis Wo Contrast  12/27/2013   CLINICAL DATA:  Lower abdominal pain for 10 days, status postcholecystectomy  EXAM: CT ABDOMEN AND PELVIS WITHOUT CONTRAST  TECHNIQUE: Multidetector CT imaging of the abdomen and pelvis was performed following the standard protocol without IV contrast.  COMPARISON:  07/28/2007.  FINDINGS: Lung bases are unremarkable. Sagittal images of the spine are unremarkable. There is small perihepatic ascites. There is hepatomegaly. The liver measures at least 22 cm in length. There is micro nodular liver contour highly  suspicious for chronic liver disease or early cirrhosis.  The study is limited without IV contrast. The patient is status postcholecystectomy.  Atherosclerotic calcifications of abdominal aorta and iliac arteries. Unenhanced pancreas and spleen is unremarkable. The spleen measures 8.9 cm in length.  Adrenal glands are unremarkable. Unenhanced kidneys are symmetrical in size. There is a probable cyst in midpole of the right  kidney measures 2 cm. No nephrolithiasis. No hydronephrosis or hydroureter. No calcified ureteral calculi are noted. Small ascites is noted in right paracolic gutter.  Small pelvic ascites. Oral contrast material was given to the patient. No small bowel obstruction. No colonic obstruction. Contrast material noted all the way in rectosigmoid colon.  There is small left inguinal hernia containing ascites measures about 1.2 cm. There is old fracture deformity of the left inferior pubic ramus.  The uterus is not identified.  No inguinal adenopathy.  IMPRESSION: 1. There is perihepatic ascites. Significant hepatomegaly. There is micro nodular liver contour. Chronic liver disease or cirrhosis cannot be excluded. Clinical correlation is necessary. 2. The patient is status post cholecystectomy.  No splenomegaly. 3. There is a cyst in midpole of the right kidney measures 2 cm. No nephrolithiasis. No hydronephrosis or hydroureter. 4. Atherosclerotic calcifications of abdominal aorta and iliac arteries. 5. Small pelvic ascites. The uterus is not identified. No small bowel or colonic obstruction.   Electronically Signed   By: Lahoma Crocker M.D.   On: 12/27/2013 18:31   Dg Chest 2 View  12/27/2013   CLINICAL DATA:  Lower abdominal pain and lower extremity swelling  EXAM: CHEST  2 VIEW  COMPARISON:  11/02/2007  FINDINGS: Cardiac shadow is within normal limits. The lungs are hyperaerated bilaterally. No focal infiltrate or sizable effusion is seen.  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   Electronically Signed   By: Inez Catalina M.D.   On: 12/27/2013 16:16   US Abdomen Complete  12/28/2013   CLINICAL DATA:  Elevated liver function tests. Acute renal insufficiency. Ethanol abuse.  EXAM: ULTRASOUND ABDOMEN COMPLETE  COMPARISON:  12/27/2013  FINDINGS: Gallbladder: Surgically absent  Common bile duct: Diameter: Normal, 5 mm.  Liver: Moderate cirrhosis. Heterogeneous echogenicity is likely related to cirrhosis and possibly  heterogeneous steatosis.  IVC: No abnormality visualized.  Pancreas: Visualized portion unremarkable.  Spleen: Size and appearance within normal limits.  Right Kidney: Length: 7.4 cm. Atrophic with increased echogenicity. No hydronephrosis. 1.9 cm interpolar cyst.  Left Kidney: Length: 7.8 cm. Atrophic and hyperechoic. 1.4 cm lower pole cyst.  Abdominal aorta: Atherosclerosis throughout the abdominal aorta.  Other findings: Small volume perihepatic ascites.  IMPRESSION: 1. Cirrhosis and small volume ascites. 2. Atrophic kidneys, most consistent with medical renal disease. 3.  Cholecystectomy, without biliary ductal dilatation.   Electronically Signed   By: Abigail Miyamoto M.D.   On: 12/28/2013 16:12    Labs:  CBC:  Recent Labs  12/27/13 2122 12/28/13 0737 12/29/13 0457 12/30/13 0533  WBC 16.3* 13.9* 15.8* 15.5*  HGB 7.2* 6.8* 11.8* 10.4*  HCT 23.2* 21.0* 36.0 32.1*  PLT 73* 67* 78* 70*    COAGS:  Recent Labs  12/27/13 2122  INR 1.40  APTT 45*    BMP:  Recent Labs  12/27/13 1250 12/28/13 0737 12/29/13 0457 12/30/13 0533  NA 134* 133* 136* 139  K 5.2 5.5* 5.7* 4.0  CL 95* 98 100 105  CO2 17* 19 13* 14*  GLUCOSE 147* 69* 40* 127*  BUN 55* 57* 61* 55*  CALCIUM 8.9 8.6 8.8 8.1*  CREATININE 2.52* 2.68* 3.03* 3.23*  GFRNONAA 18* 17* 15* 14*  GFRAA 21* 20* 17* 16*    LIVER FUNCTION TESTS:  Recent Labs  12/27/13 1250 12/28/13 0737 12/29/13 0457 12/30/13 0533  BILITOT 3.1* 3.3* 4.7* 2.8*  AST 111* 104* 104* 80*  ALT 33 28 29 24   ALKPHOS 964* 779* 763* 636*  PROT 6.0 5.2* 5.4* 4.8*  ALBUMIN 1.6* 1.4* 1.4* 1.2*   Assessment and Plan: Nodular liver contours on imaging concern for cirrhosis Hepatomegaly  Ascites Thrombocytopenia, plt 70k today Request for image guided liver biopsy Will proceed with transjugular liver biopsy with moderate sedation today Patient has been NPO, no blood thinners given, labs reviewed Risks and Benefits discussed with the patient. All  of the patient's questions were answered, patient is agreeable to proceed. Consent signed and in chart.     I spent a total of 40 minutes face to face in clinical consultation, greater than 50% of which was counseling/coordinating care.  SignedTsosie Billing D 12/30/2013, 10:08 AM

## 2013-12-30 NOTE — Progress Notes (Signed)
Dr. Carles Collet made aware of patient's refusal of lactulose. Education about medication provided to patient.

## 2013-12-30 NOTE — Plan of Care (Signed)
Problem: Phase III Progression Outcomes Goal: Voiding independently Outcome: Completed/Met Date Met:  12/30/13

## 2013-12-30 NOTE — Sedation Documentation (Signed)
Patient denies pain and is resting comfortably.  

## 2013-12-30 NOTE — Progress Notes (Signed)
PROGRESS NOTE  [name]:[id number] DOB: [birth year] DOA: 12/27/2013 PCP: No primary care provider on file.  68 year old female with a history of alcohol abuse, tobacco use presented with 3 week history of worsening abdominal pain and today history of nausea and vomiting. The patient denied any hematemesis, coffee grounds emesis, hematochezia, melena. The patient continues to drink alcohol, which at this point she states that she only drinks 2 times per month (drinks 4 drinks of whiskey at one sitting). Previously, the patient has been drinking every other day for the past 30-40 years. The patient continues to smoke, having at least 30-pack-year history. She denied any NSAIDs but stated that she used some BC powder 6 days prior to this admission. The patient does not have a primary care provider, and has essentially not seen a physician in approximately 3 years.  Assessment/Plan: Acute on chronic renal failure ( CKD 3) -Likely due to volume depletion -full abdominal ultrasound to also include kidneys--no hydronephrosis -continue IV fluid resuscitation -appreciate renal consult-->primarily from intravascular volume contraction-->started bicarb drip 12/30/13 Abdominal pain with vomiting/Tranasminasemia/Ascites -Suspect the patient has degree of esophagitis/gastritis from her alcohol use as well as NSAIDs. -CT abdomen and pelvis showed small amount of perihepatic ascites as well as pelvic ascites with cirrhotic level -tried to schedule paracentesis--not enough ascites to perform -Unfortunately, the patient was given ceftriaxone 12/27/13 which will decrease the yield of her cultures -discontinue antibiotics and observe -Plan to restart antibiotics if the patient becomes unstable clinically -Viral hepatitis serologies negative -appreciate GI followup -ANA, AMA, ASMA negative -12/30/13--liver biopsy Leukocytosis -UA--no pyruia -Plan for paracentesis--cannot be done due  to too little fluid -chest x-ray negative for infiltrates -c.diff negative -slight increase today secondary to blood transfusion -discontinue antibiotics and observe as pt is afebrile and hemodynamically stable Alcohol abuse -Alcohol withdrawal protocol -12/30/13--d/c protocol as pt has not required any ativan Thrombocytopenia -Likely due to the patient's chronic alcohol use and cirrhosis of liver -Check HIV antibody--neg -viral hepatitis serologies neg Macrocytic anemia -appreciate GI followup -has had previous EGDs 2005 and 2007 with esophagitis and duodenitis -I have personally performed a rectal examination--> Hemoccult-positive 1 of 2, brown stool, no masses -Transfused 2 units PRBC this admission Hypoglycemia -am cortisol--pending -likely due to poor po and cirrhosis -improved with po intake    Procedures/Studies: Ct Abdomen Pelvis Wo Contrast  12/27/2013   CLINICAL DATA:  Lower abdominal pain for 10 days, status postcholecystectomy  EXAM: CT ABDOMEN AND PELVIS WITHOUT CONTRAST  TECHNIQUE: Multidetector CT imaging of the abdomen and pelvis was performed following the standard protocol without IV contrast.  COMPARISON:  07/28/2007.  FINDINGS: Lung bases are unremarkable. Sagittal images of the spine are unremarkable. There is small perihepatic ascites. There is hepatomegaly. The liver measures at least 22 cm in length. There is micro nodular liver contour highly suspicious for chronic liver disease or early cirrhosis.  The study is limited without IV contrast. The patient is status postcholecystectomy.  Atherosclerotic calcifications of abdominal aorta and iliac arteries. Unenhanced pancreas and spleen is unremarkable. The spleen measures 8.9 cm in length.  Adrenal glands are unremarkable. Unenhanced kidneys are symmetrical in size. There is a probable cyst in midpole of the right kidney measures 2 cm. No nephrolithiasis. No hydronephrosis or hydroureter. No calcified ureteral calculi  are noted. Small ascites is noted in right paracolic gutter.  Small pelvic ascites. Oral contrast material was given to the patient. No small bowel obstruction. No  colonic obstruction. Contrast material noted all the way in rectosigmoid colon.  There is small left inguinal hernia containing ascites measures about 1.2 cm. There is old fracture deformity of the left inferior pubic ramus.  The uterus is not identified.  No inguinal adenopathy.  IMPRESSION: 1. There is perihepatic ascites. Significant hepatomegaly. There is micro nodular liver contour. Chronic liver disease or cirrhosis cannot be excluded. Clinical correlation is necessary. 2. The patient is status post cholecystectomy.  No splenomegaly. 3. There is a cyst in midpole of the right kidney measures 2 cm. No nephrolithiasis. No hydronephrosis or hydroureter. 4. Atherosclerotic calcifications of abdominal aorta and iliac arteries. 5. Small pelvic ascites. The uterus is not identified. No small bowel or colonic obstruction.   Electronically Signed   By: Lahoma Crocker M.D.   On: 12/27/2013 18:31   Dg Chest 2 View  12/27/2013   CLINICAL DATA:  Lower abdominal pain and lower extremity swelling  EXAM: CHEST  2 VIEW  COMPARISON:  11/02/2007  FINDINGS: Cardiac shadow is within normal limits. The lungs are hyperaerated bilaterally. No focal infiltrate or sizable effusion is seen.  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   Electronically Signed   By: Inez Catalina M.D.   On: 12/27/2013 16:16   US Abdomen Complete  12/28/2013   CLINICAL DATA:  Elevated liver function tests. Acute renal insufficiency. Ethanol abuse.  EXAM: ULTRASOUND ABDOMEN COMPLETE  COMPARISON:  12/27/2013  FINDINGS: Gallbladder: Surgically absent  Common bile duct: Diameter: Normal, 5 mm.  Liver: Moderate cirrhosis. Heterogeneous echogenicity is likely related to cirrhosis and possibly heterogeneous steatosis.  IVC: No abnormality visualized.  Pancreas: Visualized portion unremarkable.   Spleen: Size and appearance within normal limits.  Right Kidney: Length: 7.4 cm. Atrophic with increased echogenicity. No hydronephrosis. 1.9 cm interpolar cyst.  Left Kidney: Length: 7.8 cm. Atrophic and hyperechoic. 1.4 cm lower pole cyst.  Abdominal aorta: Atherosclerosis throughout the abdominal aorta.  Other findings: Small volume perihepatic ascites.  IMPRESSION: 1. Cirrhosis and small volume ascites. 2. Atrophic kidneys, most consistent with medical renal disease. 3.  Cholecystectomy, without biliary ductal dilatation.   Electronically Signed   By: Abigail Miyamoto M.D.   On: 12/28/2013 16:12         Subjective: Patient is in good spirits today.Patient denies fevers, chills, headache, chest pain, dyspnea, nausea, vomiting, diarrhea, abdominal pain, dysuria, hematuria.  She complains of a nonproductive cough without any hemoptysis   Objective: Filed Vitals:   12/30/13 1150 12/30/13 1155 12/30/13 1219 12/30/13 1606  BP: 116/66 114/63 117/60 108/68  Pulse: 99 100 98 95  Temp:   97.3 F (36.3 C) 97.8 F (36.6 C)  TempSrc:   Axillary Oral  Resp: 25 27 15 18   Height:      Weight:      SpO2: 98% 97% 99% 99%    Intake/Output Summary (Last 24 hours) at 12/30/13 1819 Last data filed at 12/30/13 1711  Gross per 24 hour  Intake  697.5 ml  Output    120 ml  Net  577.5 ml   Weight change: 6.628 kg (14 lb 9.8 oz) Exam:   General:  Pt is alert, follows commands appropriately, not in acute distress  HEENT: No icterus, No thrush, No neck mass, [place]/AT  Cardiovascular: RRR, S1/S2, no rubs, no gallops  Respiratory: CTA bilaterally, no wheezing, no crackles, no rhonchi  Abdomen: Soft/+BS, non tender, non distended, no guarding  Extremities: No edema, No lymphangitis, No petechiae, No rashes, no synovitis  Data  Reviewed: Basic Metabolic Panel:  Recent Labs Lab 12/27/13 1250 12/28/13 0737 12/29/13 0457 12/30/13 0533  NA 134* 133* 136* 139  K 5.2 5.5* 5.7* 4.0  CL 95* 98 100 105    CO2 17* 19 13* 14*  GLUCOSE 147* 69* 40* 127*  BUN 55* 57* 61* 55*  CREATININE 2.52* 2.68* 3.03* 3.23*  CALCIUM 8.9 8.6 8.8 8.1*   Liver Function Tests:  Recent Labs Lab 12/27/13 1250 12/28/13 0737 12/29/13 0457 12/30/13 0533  AST 111* 104* 104* 80*  ALT 33 28 29 24   ALKPHOS 964* 779* 763* 636*  BILITOT 3.1* 3.3* 4.7* 2.8*  PROT 6.0 5.2* 5.4* 4.8*  ALBUMIN 1.6* 1.4* 1.4* 1.2*    Recent Labs Lab 12/27/13 1250  LIPASE 57    Recent Labs Lab 12/27/13 2123  AMMONIA 80*   CBC:  Recent Labs Lab 12/27/13 1250 12/27/13 2122 12/28/13 0737 12/29/13 0457 12/30/13 0533  WBC 16.5* 16.3* 13.9* 15.8* 15.5*  NEUTROABS 13.6* 13.5*  --   --  13.1*  HGB 7.2* 7.2* 6.8* 11.8* 10.4*  HCT 23.8* 23.2* 21.0* 36.0 32.1*  MCV 100.0 97.1 97.7 95.7 94.1  PLT 67* 73* 67* 78* 70*   Cardiac Enzymes: No results for input(s): CKTOTAL, CKMB, CKMBINDEX, TROPONINI in the last 168 hours. BNP: Invalid input(s): POCBNP CBG:  Recent Labs Lab 12/29/13 0747 12/30/13 1217  GLUCAP 143* 107*    Recent Results (from the past 240 hour(s))  Clostridium Difficile by PCR     Status: None   Collection Time: 12/28/13 12:50 PM  Result Value Ref Range Status   C difficile by pcr NEGATIVE NEGATIVE Final  Urine culture     Status: None   Collection Time: 12/28/13  5:01 PM  Result Value Ref Range Status   Specimen Description URINE, CATHETERIZED  Final   Special Requests NONE  Final   Culture  Setup Time   Final    12/29/2013 01:11 Performed at [facility] Performed at [facility]   Final   Culture NO GROWTH Performed at [facility]   Final   Report Status 12/30/2013 FINAL  Final     Scheduled Meds: . benzonatate  100 mg Oral TID  . fentaNYL      . folic acid  1 mg Oral Daily  . lactulose  20 g Oral BID  . lidocaine      . midazolam      . multivitamin with minerals  1 tablet Oral Daily  . ondansetron  4 mg Intravenous Once  .  pantoprazole  40 mg Oral Daily  . thiamine  100 mg Oral Daily   Or  . thiamine  100 mg Intravenous Daily   Continuous Infusions: .  sodium bicarbonate  infusion 1000 mL 75 mL/hr at 12/30/13 1358     Adewale Pucillo, DO  [facility] Pager [phone]  If 7PM-7AM, please contact night-coverage [email or web address] Password [email or web address] 12/30/2013, 6:19 PM   LOS: 3 days

## 2013-12-31 ENCOUNTER — Encounter: Payer: Self-pay | Admitting: Physician Assistant

## 2013-12-31 LAB — RENAL FUNCTION PANEL
ANION GAP: 15 (ref 5–15)
Albumin: 1.2 g/dL — ABNORMAL LOW (ref 3.5–5.2)
BUN: 51 mg/dL — AB (ref 6–23)
CALCIUM: 8.2 mg/dL — AB (ref 8.4–10.5)
CO2: 17 mEq/L — ABNORMAL LOW (ref 19–32)
Chloride: 102 mEq/L (ref 96–112)
Creatinine, Ser: 3.08 mg/dL — ABNORMAL HIGH (ref 0.50–1.10)
GFR calc Af Amer: 17 mL/min — ABNORMAL LOW (ref >90)
GFR calc non Af Amer: 15 mL/min — ABNORMAL LOW (ref >90)
GLUCOSE: 145 mg/dL — AB (ref 70–99)
PHOSPHORUS: 4.6 mg/dL (ref 2.3–4.6)
POTASSIUM: 4.1 meq/L (ref 3.7–5.3)
Sodium: 134 mEq/L — ABNORMAL LOW (ref 137–147)

## 2013-12-31 LAB — CORTISOL-AM, BLOOD: Cortisol - AM: 18.7 ug/dL (ref 4.3–22.4)

## 2013-12-31 LAB — TROPONIN I

## 2013-12-31 MED ORDER — ENSURE COMPLETE PO LIQD: 237.0000 mL | Freq: Two times a day (BID) | ORAL | Status: DC

## 2013-12-31 MED ORDER — DEXTROSE 5 % IV SOLN
INTRAVENOUS | Status: AC
  Administered 2013-12-31: 11:00:00 via INTRAVENOUS
  Filled 2013-12-31 (×2): qty 150

## 2013-12-31 MED ORDER — SODIUM CHLORIDE 0.9 % IV SOLN
1020.0000 mg | Freq: Once | INTRAVENOUS | Status: AC
  Administered 2013-12-31: 1020 mg via INTRAVENOUS
  Filled 2013-12-31: qty 34

## 2013-12-31 NOTE — Progress Notes (Signed)
Pt had a transjugular biopsy yesterday. Pathology 3 [id number], will be read by Dr Avis Epley.

## 2013-12-31 NOTE — Progress Notes (Signed)
PT Cancellation Note  Patient Details Name: [name] MRN: [id number] DOB: 1946/01/09   Cancelled Treatment:    Reason Eval/Treat Not Completed: PT screened, no needs identified, will sign off.  PT evaluated pt on Monday with pt having no PT needs.  Went in today since new order written and pt still states she ambulates at baseline and has no PT needs.  Daughter in room and confirms this.  Will not evaluate.  Thanks.   Irwin Brakeman F 12/31/2013, 12:16 PM  [facility] [phone] (pager)

## 2013-12-31 NOTE — Progress Notes (Signed)
INITIAL NUTRITION ASSESSMENT  DOCUMENTATION CODES Per approved criteria  -Severe malnutrition in the context of chronic illness   Pt meets criteria for severe MALNUTRITION in the context of chronic illness as evidenced by moderate to severe fat and muscle depletion.  INTERVENTION: Ensure Complete po BID, each supplement provides 350 kcal and 13 grams of protein  NUTRITION DIAGNOSIS: Inadequate oral intake related to altered GI function as evidenced by n/v, variable PO intake (25-100%).   Goal: Pt will meet >90% of estimated nutritional needs  Monitor:  PO/supplement intake, labs, weight changes, I/O's  Reason for Assessment: Consult for poor PO intake  68 y.o. female  Admitting Dx: AKI (acute kidney injury)  68 year old female with a history of alcohol abuse, tobacco use presented with 3 week history of worsening abdominal pain and today history of nausea and vomiting. The patient denied any hematemesis, coffee grounds emesis, hematochezia, melena. The patient continues to drink alcohol, which at this point she states that she only drinks 2 times per month (drinks 4 drinks of whiskey at one sitting).   ASSESSMENT: Pt admitted for AKI. Pt reports that her appetite is fair to good at baseline. Noted 45-100% of meal completion. Lunch tray at bedside table was only about 10% completed- pt reported that she vomited after eating the yogurt, suspecting that "it didn't agree with me". Pt reports that she has always been a small person. She reports that she has recently gained wt by eating better. She also reports that she drinks Boost at home 3 times per day (very vanilla flavor). She does not like chocolate, reporting it has a laxative type of effect on her. She is agreeable to Ensure, due to increased flavor options. Educated pt on importance of good PO and supplement intake to support healing process.  Labs reviewed. Na: 134, Cl, BUN/Creat: 17 51/3.08, Calcium: 8.2, Glucose: 145. CBGS:  107-143. K and Mg WDL.   Nutrition Focused Physical Exam:  Subcutaneous Fat:  Orbital Region: moderate depletion Upper Arm Region: moderate depletion Thoracic and Lumbar Region: severe depletion  Muscle:  Temple Region: moderate depletion Clavicle Bone Region: severe depletion Clavicle and Acromion Bone Region: severe depletion Scapular Bone Region: severe depletion Dorsal Hand: severe depletion Patellar Region: mild depletion Anterior Thigh Region: mild depletion Posterior Calf Region: mild depletion  Edema: none present  Height: Ht Readings from Last 1 Encounters:  12/27/13 5\' 5"  (1.651 m)    Weight: Wt Readings from Last 1 Encounters:  12/31/13 105 lb 12.8 oz (47.991 kg)    Ideal Body Weight: 125#  % Ideal Body Weight: 84%  Wt Readings from Last 10 Encounters:  12/31/13 105 lb 12.8 oz (47.991 kg)  05/12/11 100 lb (45.36 kg)    Usual Body Weight: 100#  % Usual Body Weight: 105%  BMI:  Body mass index is 17.61 kg/(m^2). Underweight  Estimated Nutritional Needs: Kcal: 1400-1600 Protein: 62-72 grams Fluid: 1.4-1.6 L  Skin: closed neck incision, ecchymosis on bilateral ankles  Diet Order: Diet regular  EDUCATION NEEDS: -Education needs addressed   Intake/Output Summary (Last 24 hours) at 12/31/13 1430 Last data filed at 12/31/13 1424  Gross per 24 hour  Intake 2185.25 ml  Output    370 ml  Net 1815.25 ml    Last BM: 12/29/13  Labs:   Recent Labs Lab 12/29/13 0457 12/30/13 0533 12/31/13 0046  NA 136* 139 134*  K 5.7* 4.0 4.1  CL 100 105 102  CO2 13* 14* 17*  BUN 61* 55* 51*  CREATININE 3.03* 3.23* 3.08*  CALCIUM 8.8 8.1* 8.2*  PHOS  --   --  4.6  GLUCOSE 40* 127* 145*    CBG (last 3)   Recent Labs  12/29/13 0747 12/30/13 1217  GLUCAP 143* 107*    Scheduled Meds: . benzonatate  100 mg Oral TID  . folic acid  1 mg Oral Daily  . lactulose  20 g Oral BID  . multivitamin with minerals  1 tablet Oral Daily  . ondansetron  4  mg Intravenous Once  . pantoprazole  40 mg Oral Daily  . thiamine  100 mg Oral Daily    Continuous Infusions: .  sodium bicarbonate  infusion 1000 mL 75 mL/hr at 12/31/13 1033    Past Medical History  Diagnosis Date  . Cirrhosis 12/2013    Past Surgical History  Procedure Laterality Date  . Cholecystectomy    . Orif ankle fracture bimalleolar Left 2003    Soliyana Mcchristian A. Jimmye Norman, RD, LDN Pager: [phone] After hours Pager: [phone]

## 2013-12-31 NOTE — Progress Notes (Addendum)
Patient Demographics  [name], is a 68 y.o. female, DOB - [birth year], IOE:[id number]  Admit date - 12/27/2013   Admitting Physician Berle Mull, MD  Outpatient Primary MD for the patient is No primary care provider on file.  LOS - 4   Chief Complaint  Patient presents with  . Abdominal Pain  . Shortness of Breath        Subjective:   [name] today has, No headache, No chest pain, No abdominal pain - No Nausea, No new weakness tingling or numbness, No Cough - SOB.    Assessment & Plan    68 year old female with a history of alcohol abuse, tobacco use presented with 3 week history of worsening abdominal pain and today history of nausea and vomiting. The patient denied any hematemesis, coffee grounds emesis, hematochezia, melena. The patient continues to drink alcohol, which at this point she states that she only drinks 2 times per month (drinks 4 drinks of whiskey at one sitting). Previously, the patient has been drinking every other day for the past 30-40 years. The patient continues to smoke, having at least 30-pack-year history. She denied any NSAIDs but stated that she used some BC powder 6 days prior to this admission. The patient does not have a primary care provider, and has essentially not seen a physician in approximately 3 years.  Assessment/Plan:  Acute on chronic renal failure ( CKD 3) -Likely due to volume depletion, no hydronephrosis, improving with IV fluids. Renal following.  - Still has metabolic acidosis. Bicarbonate per renal if needed.    Abdominal pain with vomiting/Tranasminasemia/Ascites -Suspect the patient has degree of esophagitis/gastritis from her alcohol use as well as NSAIDs. -CT abdomen and pelvis showed small amount of perihepatic ascites  as well as pelvic ascites with cirrhotic level,not enough ascites for paracentesis, currently off antibiotics, hepatitis serology negative, GI following, ANA, AMA, ASMA negative.  12/30/13--liver biopsy pending results will need outpatient GI follow-up.    Leukocytosis -likely reactionary, afebrile monitor. -UA--no pyruia, paracentesis--cannot be done due to too little fluid, chest x-ray negative for infiltrates, c.diff negative    Alcohol abuse -Counseled to quit, no signs of DTs, folic acid thiamine.   Thrombocytopenia -Likely due to the patient's chronic alcohol use and cirrhosis of liver,no signs of bleed. HIV and hepatitis serology negative    Macrocytic anemia -appreciate GI followup, H/O esophagitis and duodenitis, One out of 2 Hemoccult-positive stool. Status post 2 units of packed RBC transfusion on 12/30/2013 with stable H&H. -Further workup per GI. Will require outpatient GI follow-up.    Hypoglycemia -AM random cortisol stable 18.7, likely due to poor oral intake. Stable glucose this morning  CBG (last 3)   Recent Labs  12/29/13 0747 12/30/13 1217  GLUCAP 143* 107*         Code Status: Full  Family Communication: None present  Disposition Plan: Home   Procedures Per IR - trans Jugular liver biopsy, CT scan abdomen and pelvis, right upper quadrant ultrasound   Consults  GI, IR, renal   Medications  Scheduled Meds: . benzonatate  100 mg Oral TID  . folic acid  1 mg Oral Daily  . lactulose  20 g Oral BID  . multivitamin with minerals  1 tablet  Oral Daily  . ondansetron  4 mg Intravenous Once  . pantoprazole  40 mg Oral Daily  . thiamine  100 mg Oral Daily   Continuous Infusions:  PRN Meds:.loperamide, LORazepam **OR** LORazepam, ondansetron **OR** ondansetron (ZOFRAN) IV, oxyCODONE  DVT Prophylaxis    SCDs    Lab Results  Component Value Date   PLT 70* 12/30/2013    Antibiotics    Anti-infectives    Start     Dose/Rate Route  Frequency Ordered Stop   12/27/13 2315  cefTRIAXone (ROCEPHIN) 1 g in dextrose 5 % 50 mL IVPB - Premix  Status:  Discontinued     1 g100 mL/hr over 30 Minutes Intravenous Every 24 hours 12/27/13 2304 12/29/13 1353          Objective:   Filed Vitals:   12/30/13 1219 12/30/13 1606 12/30/13 2139 12/31/13 0527  BP: 117/60 108/68 116/66 105/61  Pulse: 98 95 89 103  Temp: 97.3 F (36.3 C) 97.8 F (36.6 C) 98.4 F (36.9 C) 98.4 F (36.9 C)  TempSrc: Axillary Oral Oral Oral  Resp: 15 18 18 21   Height:      Weight:    47.991 kg (105 lb 12.8 oz)  SpO2: 99% 99% 100% 95%    Wt Readings from Last 3 Encounters:  12/31/13 47.991 kg (105 lb 12.8 oz)  05/12/11 45.36 kg (100 lb)     Intake/Output Summary (Last 24 hours) at 12/31/13 0937 Last data filed at 12/31/13 0743  Gross per 24 hour  Intake 1789.25 ml  Output    370 ml  Net 1419.25 ml     Physical Exam  Awake Alert, Oriented X 3, No new F.N deficits, Normal affect [place].AT,PERRAL Supple Neck,No JVD, No cervical lymphadenopathy appriciated.  Symmetrical Chest wall movement, Good air movement bilaterally, CTAB RRR,No Gallops,Rubs or new Murmurs, No Parasternal Heave +ve B.Sounds, Abd Soft, No tenderness, No organomegaly appriciated, No rebound - guarding or rigidity. No Cyanosis, Clubbing or edema, No new Rash or bruise      Data Review   Micro Results Recent Results (from the past 240 hour(s))  Clostridium Difficile by PCR     Status: None   Collection Time: 12/28/13 12:50 PM  Result Value Ref Range Status   C difficile by pcr NEGATIVE NEGATIVE Final  Urine culture     Status: None   Collection Time: 12/28/13  5:01 PM  Result Value Ref Range Status   Specimen Description URINE, CATHETERIZED  Final   Special Requests NONE  Final   Culture  Setup Time   Final    12/29/2013 01:11 Performed at [place] Performed at [facility]   Final   Culture NO GROWTH Performed at  [facility]   Final   Report Status 12/30/2013 FINAL  Final    Radiology Reports Ct Abdomen Pelvis Wo Contrast  12/27/2013   CLINICAL DATA:  Lower abdominal pain for 10 days, status postcholecystectomy  EXAM: CT ABDOMEN AND PELVIS WITHOUT CONTRAST  TECHNIQUE: Multidetector CT imaging of the abdomen and pelvis was performed following the standard protocol without IV contrast.  COMPARISON:  07/28/2007.  FINDINGS: Lung bases are unremarkable. Sagittal images of the spine are unremarkable. There is small perihepatic ascites. There is hepatomegaly. The liver measures at least 22 cm in length. There is micro nodular liver contour highly suspicious for chronic liver disease or early cirrhosis.  The study is limited without IV contrast. The patient  is status postcholecystectomy.  Atherosclerotic calcifications of abdominal aorta and iliac arteries. Unenhanced pancreas and spleen is unremarkable. The spleen measures 8.9 cm in length.  Adrenal glands are unremarkable. Unenhanced kidneys are symmetrical in size. There is a probable cyst in midpole of the right kidney measures 2 cm. No nephrolithiasis. No hydronephrosis or hydroureter. No calcified ureteral calculi are noted. Small ascites is noted in right paracolic gutter.  Small pelvic ascites. Oral contrast material was given to the patient. No small bowel obstruction. No colonic obstruction. Contrast material noted all the way in rectosigmoid colon.  There is small left inguinal hernia containing ascites measures about 1.2 cm. There is old fracture deformity of the left inferior pubic ramus.  The uterus is not identified.  No inguinal adenopathy.  IMPRESSION: 1. There is perihepatic ascites. Significant hepatomegaly. There is micro nodular liver contour. Chronic liver disease or cirrhosis cannot be excluded. Clinical correlation is necessary. 2. The patient is status post cholecystectomy.  No splenomegaly. 3. There is a cyst in midpole of the right kidney  measures 2 cm. No nephrolithiasis. No hydronephrosis or hydroureter. 4. Atherosclerotic calcifications of abdominal aorta and iliac arteries. 5. Small pelvic ascites. The uterus is not identified. No small bowel or colonic obstruction.   Electronically Signed   By: Lahoma Crocker M.D.   On: 12/27/2013 18:31   Dg Chest 2 View  12/27/2013   CLINICAL DATA:  Lower abdominal pain and lower extremity swelling  EXAM: CHEST  2 VIEW  COMPARISON:  11/02/2007  FINDINGS: Cardiac shadow is within normal limits. The lungs are hyperaerated bilaterally. No focal infiltrate or sizable effusion is seen.  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   Electronically Signed   By: Inez Catalina M.D.   On: 12/27/2013 16:16   US Abdomen Complete  12/28/2013   CLINICAL DATA:  Elevated liver function tests. Acute renal insufficiency. Ethanol abuse.  EXAM: ULTRASOUND ABDOMEN COMPLETE  COMPARISON:  12/27/2013  FINDINGS: Gallbladder: Surgically absent  Common bile duct: Diameter: Normal, 5 mm.  Liver: Moderate cirrhosis. Heterogeneous echogenicity is likely related to cirrhosis and possibly heterogeneous steatosis.  IVC: No abnormality visualized.  Pancreas: Visualized portion unremarkable.  Spleen: Size and appearance within normal limits.  Right Kidney: Length: 7.4 cm. Atrophic with increased echogenicity. No hydronephrosis. 1.9 cm interpolar cyst.  Left Kidney: Length: 7.8 cm. Atrophic and hyperechoic. 1.4 cm lower pole cyst.  Abdominal aorta: Atherosclerosis throughout the abdominal aorta.  Other findings: Small volume perihepatic ascites.  IMPRESSION: 1. Cirrhosis and small volume ascites. 2. Atrophic kidneys, most consistent with medical renal disease. 3.  Cholecystectomy, without biliary ductal dilatation.   Electronically Signed   By: Abigail Miyamoto M.D.   On: 12/28/2013 16:12     CBC  Recent Labs Lab 12/27/13 1250 12/27/13 2122 12/28/13 0737 12/29/13 0457 12/30/13 0533  WBC 16.5* 16.3* 13.9* 15.8* 15.5*  HGB 7.2* 7.2*  6.8* 11.8* 10.4*  HCT 23.8* 23.2* 21.0* 36.0 32.1*  PLT 67* 73* 67* 78* 70*  MCV 100.0 97.1 97.7 95.7 94.1  MCH 30.3 30.1 31.6 31.4 30.5  MCHC 30.3 31.0 32.4 32.8 32.4  RDW 23.4* 23.2* 23.5* 20.5* 20.1*  LYMPHSABS 1.0 1.6  --   --  0.8  MONOABS 1.5* 1.0  --   --  1.2*  EOSABS 0.2 0.2  --   --  0.3  BASOSABS 0.2* 0.0  --   --  0.1    Chemistries   Recent Labs Lab 12/27/13 1250 12/28/13 0737 12/29/13 0457 12/30/13  0533 12/31/13 0046  NA 134* 133* 136* 139 134*  K 5.2 5.5* 5.7* 4.0 4.1  CL 95* 98 100 105 102  CO2 17* 19 13* 14* 17*  GLUCOSE 147* 69* 40* 127* 145*  BUN 55* 57* 61* 55* 51*  CREATININE 2.52* 2.68* 3.03* 3.23* 3.08*  CALCIUM 8.9 8.6 8.8 8.1* 8.2*  AST 111* 104* 104* 80*  --   ALT 33 28 29 24   --   ALKPHOS 964* 779* 763* 636*  --   BILITOT 3.1* 3.3* 4.7* 2.8*  --    ------------------------------------------------------------------------------------------------------------------ estimated creatinine clearance is 13.2 mL/min (by C-G formula based on Cr of 3.08). ------------------------------------------------------------------------------------------------------------------ No results for input(s): HGBA1C in the last 72 hours. ------------------------------------------------------------------------------------------------------------------ No results for input(s): CHOL, HDL, LDLCALC, TRIG, CHOLHDL, LDLDIRECT in the last 72 hours. ------------------------------------------------------------------------------------------------------------------ No results for input(s): TSH, T4TOTAL, T3FREE, THYROIDAB in the last 72 hours.  Invalid input(s): FREET3 ------------------------------------------------------------------------------------------------------------------  Recent Labs  12/28/13 1015  FERRITIN 127  TIBC 218*  IRON 35*    Coagulation profile  Recent Labs Lab 12/27/13 2122  INR 1.40    No results for input(s): DDIMER in the last 72  hours.  Cardiac Enzymes  Recent Labs Lab 12/31/13 0046  TROPONINI <0.30   ------------------------------------------------------------------------------------------------------------------ Invalid input(s): POCBNP     Time Spent in minutes  35   Deziray Nabi K M.D on 12/31/2013 at 9:37 AM  Between 7am to 7pm - Pager - [phone]  After 7pm go to [email or web address] - password [email or web address]  And look for the night coverage person covering for me after hours  [facility] Office  [phone]

## 2013-12-31 NOTE — Progress Notes (Signed)
Patient ID: LEXI CONATY, female   DOB: [birth year], 68 y.o.   MRN: [id number]  [facility] Progress Note    Assessment/ Plan:   1. 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 History and data point to volume depletion causing AKI. Continue IVFs for another 24hrs and monitor UOP/renal function. No acute HD needs 2. Anion gap metabolic acidosis: Likely secondary from acute renal failure, continue intravenous isotonic sodium bicarbonate. 3. Cirrhosis: Ongoing gastroenterology workup and now status post transjugular hepatic biopsy. Suspected to be from alcohol use. 4. Anemia: Possibly associated with preceding alcohol use,  prescribed intravenous iron therapy. 5. Cough: evidence of COPD on chest x-ray, will give trial of Tessalon Perles as her coughing worsens abdominal pain.  Subjective:   Reports to have had a good night- cough somewhat better on tessalon   Objective:   BP 105/61 mmHg  Pulse 103  Temp(Src) 98.4 F (36.9 C) (Oral)  Resp 21  Ht 5\' 5"  (1.651 m)  Wt 47.991 kg (105 lb 12.8 oz)  BMI 17.61 kg/m2  SpO2 95%  Intake/Output Summary (Last 24 hours) at 12/31/13 0944 Last data filed at 12/31/13 0900  Gross per 24 hour  Intake 1909.25 ml  Output    370 ml  Net 1539.25 ml   Weight change: 1.99 kg (4 lb 6.2 oz)  Physical Exam: SFK:CLEXNTZGYFV resting in bed CBS:WHQPR RRR, normal s1 and s2 Resp: intermittent expiratory rhonchi- distant breath sounds FFM:BWGY, slightly distended, BS normal Ext:No LE edema  Imaging: No results found.  Labs: BMET  Recent Labs Lab 12/27/13 1250 12/28/13 0737 12/29/13 0457 12/30/13 0533 12/31/13 0046  NA 134* 133* 136* 139 134*  K 5.2 5.5* 5.7* 4.0 4.1  CL 95* 98 100 105 102  CO2 17* 19 13* 14* 17*  GLUCOSE 147* 69* 40* 127* 145*  BUN 55* 57* 61* 55* 51*  CREATININE 2.52* 2.68* 3.03* 3.23* 3.08*   CALCIUM 8.9 8.6 8.8 8.1* 8.2*  PHOS  --   --   --   --  4.6   CBC  Recent Labs Lab 12/27/13 1250 12/27/13 2122 12/28/13 0737 12/29/13 0457 12/30/13 0533  WBC 16.5* 16.3* 13.9* 15.8* 15.5*  NEUTROABS 13.6* 13.5*  --   --  13.1*  HGB 7.2* 7.2* 6.8* 11.8* 10.4*  HCT 23.8* 23.2* 21.0* 36.0 32.1*  MCV 100.0 97.1 97.7 95.7 94.1  PLT 67* 73* 67* 78* 70*   Medications:    . benzonatate  100 mg Oral TID  . folic acid  1 mg Oral Daily  . lactulose  20 g Oral BID  . multivitamin with minerals  1 tablet Oral Daily  . ondansetron  4 mg Intravenous Once  . pantoprazole  40 mg Oral Daily  . thiamine  100 mg Oral Daily   Elmarie Shiley, MD 12/31/2013, 9:44 AM

## 2013-12-31 NOTE — Plan of Care (Signed)
Problem: Discharge Progression Outcomes Goal: Pain controlled with appropriate interventions Outcome: Progressing     

## 2014-01-01 LAB — RENAL FUNCTION PANEL
ANION GAP: 17 — AB (ref 5–15)
Albumin: 1.1 g/dL — ABNORMAL LOW (ref 3.5–5.2)
BUN: 48 mg/dL — ABNORMAL HIGH (ref 6–23)
CHLORIDE: 98 meq/L (ref 96–112)
CO2: 25 mEq/L (ref 19–32)
Calcium: 8.4 mg/dL (ref 8.4–10.5)
Creatinine, Ser: 2.9 mg/dL — ABNORMAL HIGH (ref 0.50–1.10)
GFR, EST AFRICAN AMERICAN: 18 mL/min — AB (ref >90)
GFR, EST NON AFRICAN AMERICAN: 16 mL/min — AB (ref >90)
Glucose, Bld: 103 mg/dL — ABNORMAL HIGH (ref 70–99)
POTASSIUM: 3.7 meq/L (ref 3.7–5.3)
Phosphorus: 4.1 mg/dL (ref 2.3–4.6)
SODIUM: 140 meq/L (ref 137–147)

## 2014-01-01 LAB — HEPATIC FUNCTION PANEL
ALBUMIN: 1.1 g/dL — AB (ref 3.5–5.2)
ALT: 21 U/L (ref 0–35)
AST: 57 U/L — AB (ref 0–37)
Alkaline Phosphatase: 562 U/L — ABNORMAL HIGH (ref 39–117)
Bilirubin, Direct: 2.9 mg/dL — ABNORMAL HIGH (ref 0.0–0.3)
Indirect Bilirubin: 0.4 mg/dL (ref 0.3–0.9)
Total Bilirubin: 3.3 mg/dL — ABNORMAL HIGH (ref 0.3–1.2)
Total Protein: 4.5 g/dL — ABNORMAL LOW (ref 6.0–8.3)

## 2014-01-01 LAB — CBC
HCT: 33 % — ABNORMAL LOW (ref 36.0–46.0)
Hemoglobin: 10.9 g/dL — ABNORMAL LOW (ref 12.0–15.0)
MCH: 31.4 pg (ref 26.0–34.0)
MCHC: 33 g/dL (ref 30.0–36.0)
MCV: 95.1 fL (ref 78.0–100.0)
PLATELETS: 44 10*3/uL — AB (ref 150–400)
RBC: 3.47 MIL/uL — ABNORMAL LOW (ref 3.87–5.11)
RDW: 20.2 % — AB (ref 11.5–15.5)
WBC: 17.1 10*3/uL — AB (ref 4.0–10.5)

## 2014-01-01 MED ORDER — THIAMINE HCL 100 MG PO TABS: 100.0000 mg | ORAL_TABLET | Freq: Every day | ORAL | Status: AC

## 2014-01-01 MED ORDER — LACTULOSE 10 GM/15ML PO SOLN: 20.0000 g | Freq: Two times a day (BID) | ORAL | Status: DC

## 2014-01-01 MED ORDER — ENSURE COMPLETE PO LIQD: 237.0000 mL | Freq: Two times a day (BID) | ORAL | Status: AC

## 2014-01-01 MED ORDER — PANTOPRAZOLE SODIUM 40 MG PO TBEC: 40.0000 mg | DELAYED_RELEASE_TABLET | Freq: Two times a day (BID) | ORAL | Status: DC

## 2014-01-01 MED ORDER — FOLIC ACID 1 MG PO TABS: 1.0000 mg | ORAL_TABLET | Freq: Every day | ORAL | Status: AC

## 2014-01-01 MED ORDER — ONDANSETRON HCL 4 MG PO TABS: 4.0000 mg | ORAL_TABLET | Freq: Four times a day (QID) | ORAL | Status: AC | PRN

## 2014-01-01 NOTE — Progress Notes (Signed)
Preliminary results of the liver biopsy: malignant cells, ? Adenocarcinoma , not sure yet if primary or metastatic. Special stains pending.

## 2014-01-01 NOTE — Discharge Instructions (Signed)
Follow with Primary MD in 7 days  ° °Get CBC, CMP, 2 view Chest X ray checked  by Primary MD next visit.  ° ° °Activity: As tolerated with Full fall precautions use walker/cane & assistance as needed ° ° °Disposition Home   ° ° °Diet: Heart Healthy   ° °For Heart failure patients - Check your Weight same time everyday, if you gain over 2 pounds, or you develop in leg swelling, experience more shortness of breath or chest pain, call your Primary MD immediately. Follow Cardiac Low Salt Diet and 1.8 lit/day fluid restriction. ° ° °On your next visit with your primary care physician please Get Medicines reviewed and adjusted. ° ° °Please request your Prim.MD to go over all Hospital Tests and Procedure/Radiological results at the follow up, please get all Hospital records sent to your Prim MD by signing hospital release before you go home. ° ° °If you experience worsening of your admission symptoms, develop shortness of breath, life threatening emergency, suicidal or homicidal thoughts you must seek medical attention immediately by calling 911 or calling your MD immediately  if symptoms less severe. ° °You Must read complete instructions/literature along with all the possible adverse reactions/side effects for all the Medicines you take and that have been prescribed to you. Take any new Medicines after you have completely understood and accpet all the possible adverse reactions/side effects.  ° °Do not drive, operating heavy machinery, perform activities at heights, swimming or participation in water activities or provide baby sitting services if your were admitted for syncope or siezures until you have seen by Primary MD or a Neurologist and advised to do so again. ° °Do not drive when taking Pain medications.  ° ° °Do not take more than prescribed Pain, Sleep and Anxiety Medications ° °Special Instructions: If you have smoked or chewed Tobacco  in the last 2 yrs please stop smoking, stop any regular Alcohol  and or any  Recreational drug use. ° °Wear Seat belts while driving. ° ° °Please note ° °You were cared for by a hospitalist during your hospital stay. If you have any questions about your discharge medications or the care you received while you were in the hospital after you are discharged, you can call the unit and asked to speak with the hospitalist on call if the hospitalist that took care of you is not available. Once you are discharged, your primary care physician will handle any further medical issues. Please note that NO REFILLS for any discharge medications will be authorized once you are discharged, as it is imperative that you return to your primary care physician (or establish a relationship with a primary care physician if you do not have one) for your aftercare needs so that they can reassess your need for medications and monitor your lab values. ° ° °

## 2014-01-01 NOTE — Progress Notes (Signed)
Patient ID: [name], female   DOB: [birth year], 68 y.o.   MRN: [id number]  [facility] Progress Note    Assessment/ Plan:   1. 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 History and data pointed to volume depletion causing AKI. Will arrange OP renal follow up with me 2. Anion gap metabolic acidosis: Likely secondary from acute renal failure, DC without sodium bicarbonate (will decide on this at follow up) 3. Cirrhosis: Ongoing gastroenterology workup and now status post transjugular hepatic biopsy. Suspected to be from alcohol use. 4. Anemia: Possibly associated with preceding alcohol use, prescribed intravenous iron therapy. 5. Cough: evidence of COPD on chest x-ray, to follow up with her PCP.  Subjective:   Reports to be feeling fair and excited about going home   Objective:   BP 94/57 mmHg  Pulse 104  Temp(Src) 98.3 F (36.8 C) (Oral)  Resp 20  Ht $R'5\' 5"'ED$  (1.651 m)  Wt 49.986 kg (110 lb 3.2 oz)  BMI 18.34 kg/m2  SpO2 100%  Intake/Output Summary (Last 24 hours) at 01/01/14 1043 Last data filed at 01/01/14 1026  Gross per 24 hour  Intake   1472 ml  Output      0 ml  Net   1472 ml   Weight change: 1.996 kg (4 lb 6.4 oz)  Physical Exam: IZT:[id number] resting in bed SNK:NLZJQ RRR, normal s1 and s2 Resp:CTA bilaterally, no rales BHA:LPFX, flat, NT, BS normal Ext:No LE edema  Imaging: Ir Venogram Hepatic W Hemodynamic Evaluation  12/31/2013   INDICATION: New diagnosis of cirrhosis. Now with ascites. Please perform transjugular liver biopsy for tissue diagnostic purposes.  EXAM: FLUOROSCOPIC GUIDED TRANSJUGULAR LIVER BIOPSY  ULTRASOUND GUIDANCE FOR VENOUS ACCESS  COMPARISON:  CT abdomen pelvis -12/27/2013  MEDICATIONS: Fentanyl 50 mcg IV; Versed 1 mg IV  CONTRAST:  None.  CO2 was utilized given renal insufficiency.  ANESTHESIA/SEDATION: Total  Moderate Sedation Time  30 minutes  FLUOROSCOPY TIME:  2 minutes.  36 seconds.  COMPLICATIONS: None immediate  TECHNIQUE: Informed written consent was obtained from the patient after a discussion of the risks, benefits and alternatives to treatment. Questions regarding the procedure were encouraged and answered. A timeout was performed prior to the initiation of the procedure.  The right neck was prepped and draped in the usual sterile fashion, and a sterile drape was applied covering the operative field. Maximum barrier sterile technique with sterile gowns and gloves were used for the procedure. A timeout was performed prior to the initiation of the procedure. Local anesthesia was provided with 1% lidocaine with epinephrine.  The right internal jugular vein was accessed with a micropuncture kit under direct ultrasound guidance. An ultrasound image was saved for documentation purposes. The micropuncture sheath was exchanged for a 9-French vascular sheath over a Benson wire. A 100 cm Bernstein catheter was advanced over a stiff Glidewire to select the middle hepatic vein. A CO2 hepatic venogram was performed. Free hepatic and right atrial pressures were obtained with the Berenstein catheter. The Berenstein catheter was exchanged for a Dextera transjugular biopsy sheath. 3 core needle biopsies were obtained and anterior to the middle hepatic vein under direct fluoroscopic guidance. Samples were deemed adequate, placed in formalin and submitted to pathology. At this point the procedure was terminated. All wires, catheters and sheaths removed and the patient. Hemostasis was achieved at the right neck access site with manual compression. A dressing was  placed. The patient tolerated the procedure well without immediate postprocedural complication.  FINDINGS: Normal hepatic venogram.  Pressure measurements as follows:  Free hepatic  12/5 (mean - 8)  Wedged hepatic  24/7 (mean - 17)  Gradient  9  Successful acquisition of 3  core needle biopsies anterior to the middle hepatic vein.  IMPRESSION: 1. No portosystemic gradient to suggest portal venous hypertension. 2. Successful fluoroscopic guided transjugular liver biopsy. 3. Unremarkable hepatic CO2 venogram.   Electronically Signed   By: Sandi Mariscal M.D.   On: 12/31/2013 11:44   Ir Transcatheter Bx  12/31/2013   INDICATION: New diagnosis of cirrhosis. Now with ascites. Please perform transjugular liver biopsy for tissue diagnostic purposes.  EXAM: FLUOROSCOPIC GUIDED TRANSJUGULAR LIVER BIOPSY  ULTRASOUND GUIDANCE FOR VENOUS ACCESS  COMPARISON:  CT abdomen pelvis -12/27/2013  MEDICATIONS: Fentanyl 50 mcg IV; Versed 1 mg IV  CONTRAST:  None.  CO2 was utilized given renal insufficiency.  ANESTHESIA/SEDATION: Total Moderate Sedation Time  30 minutes  FLUOROSCOPY TIME:  2 minutes.  36 seconds.  COMPLICATIONS: None immediate  TECHNIQUE: Informed written consent was obtained from the patient after a discussion of the risks, benefits and alternatives to treatment. Questions regarding the procedure were encouraged and answered. A timeout was performed prior to the initiation of the procedure.  The right neck was prepped and draped in the usual sterile fashion, and a sterile drape was applied covering the operative field. Maximum barrier sterile technique with sterile gowns and gloves were used for the procedure. A timeout was performed prior to the initiation of the procedure. Local anesthesia was provided with 1% lidocaine with epinephrine.  The right internal jugular vein was accessed with a micropuncture kit under direct ultrasound guidance. An ultrasound image was saved for documentation purposes. The micropuncture sheath was exchanged for a 9-French vascular sheath over a Benson wire. A 100 cm Bernstein catheter was advanced over a stiff Glidewire to select the middle hepatic vein. A CO2 hepatic venogram was performed. Free hepatic and right atrial pressures were obtained with the  Berenstein catheter. The Berenstein catheter was exchanged for a Dextera transjugular biopsy sheath. 3 core needle biopsies were obtained and anterior to the middle hepatic vein under direct fluoroscopic guidance. Samples were deemed adequate, placed in formalin and submitted to pathology. At this point the procedure was terminated. All wires, catheters and sheaths removed and the patient. Hemostasis was achieved at the right neck access site with manual compression. A dressing was placed. The patient tolerated the procedure well without immediate postprocedural complication.  FINDINGS: Normal hepatic venogram.  Pressure measurements as follows:  Free hepatic  12/5 (mean - 8)  Wedged hepatic  24/7 (mean - 17)  Gradient  9  Successful acquisition of 3 core needle biopsies anterior to the middle hepatic vein.  IMPRESSION: 1. No portosystemic gradient to suggest portal venous hypertension. 2. Successful fluoroscopic guided transjugular liver biopsy. 3. Unremarkable hepatic CO2 venogram.   Electronically Signed   By: Sandi Mariscal M.D.   On: 12/31/2013 11:44   Ir US Guide Vasc Access Right  12/31/2013   INDICATION: New diagnosis of cirrhosis. Now with ascites. Please perform transjugular liver biopsy for tissue diagnostic purposes.  EXAM: FLUOROSCOPIC GUIDED TRANSJUGULAR LIVER BIOPSY  ULTRASOUND GUIDANCE FOR VENOUS ACCESS  COMPARISON:  CT abdomen pelvis -12/27/2013  MEDICATIONS: Fentanyl 50 mcg IV; Versed 1 mg IV  CONTRAST:  None.  CO2 was utilized given renal insufficiency.  ANESTHESIA/SEDATION: Total Moderate Sedation Time  30 minutes  FLUOROSCOPY  TIME:  2 minutes.  36 seconds.  COMPLICATIONS: None immediate  TECHNIQUE: Informed written consent was obtained from the patient after a discussion of the risks, benefits and alternatives to treatment. Questions regarding the procedure were encouraged and answered. A timeout was performed prior to the initiation of the procedure.  The right neck was prepped and draped in  the usual sterile fashion, and a sterile drape was applied covering the operative field. Maximum barrier sterile technique with sterile gowns and gloves were used for the procedure. A timeout was performed prior to the initiation of the procedure. Local anesthesia was provided with 1% lidocaine with epinephrine.  The right internal jugular vein was accessed with a micropuncture kit under direct ultrasound guidance. An ultrasound image was saved for documentation purposes. The micropuncture sheath was exchanged for a 9-French vascular sheath over a Benson wire. A 100 cm Bernstein catheter was advanced over a stiff Glidewire to select the middle hepatic vein. A CO2 hepatic venogram was performed. Free hepatic and right atrial pressures were obtained with the Berenstein catheter. The Berenstein catheter was exchanged for a Dextera transjugular biopsy sheath. 3 core needle biopsies were obtained and anterior to the middle hepatic vein under direct fluoroscopic guidance. Samples were deemed adequate, placed in formalin and submitted to pathology. At this point the procedure was terminated. All wires, catheters and sheaths removed and the patient. Hemostasis was achieved at the right neck access site with manual compression. A dressing was placed. The patient tolerated the procedure well without immediate postprocedural complication.  FINDINGS: Normal hepatic venogram.  Pressure measurements as follows:  Free hepatic  12/5 (mean - 8)  Wedged hepatic  24/7 (mean - 17)  Gradient  9  Successful acquisition of 3 core needle biopsies anterior to the middle hepatic vein.  IMPRESSION: 1. No portosystemic gradient to suggest portal venous hypertension. 2. Successful fluoroscopic guided transjugular liver biopsy. 3. Unremarkable hepatic CO2 venogram.   Electronically Signed   By: Sandi Mariscal M.D.   On: 12/31/2013 11:44    Labs: BMET  Recent Labs Lab 12/27/13 1250 12/28/13 0737 12/29/13 0457 12/30/13 0533 12/31/13 0046  01/01/14 0536  NA 134* 133* 136* 139 134* 140  K 5.2 5.5* 5.7* 4.0 4.1 3.7  CL 95* 98 100 105 102 98  CO2 17* 19 13* 14* 17* 25  GLUCOSE 147* 69* 40* 127* 145* 103*  BUN 55* 57* 61* 55* 51* 48*  CREATININE 2.52* 2.68* 3.03* 3.23* 3.08* 2.90*  CALCIUM 8.9 8.6 8.8 8.1* 8.2* 8.4  PHOS  --   --   --   --  4.6 4.1   CBC  Recent Labs Lab 12/27/13 1250 12/27/13 2122 12/28/13 0737 12/29/13 0457 12/30/13 0533 01/01/14 0536  WBC 16.5* 16.3* 13.9* 15.8* 15.5* 17.1*  NEUTROABS 13.6* 13.5*  --   --  13.1*  --   HGB 7.2* 7.2* 6.8* 11.8* 10.4* 10.9*  HCT 23.8* 23.2* 21.0* 36.0 32.1* 33.0*  MCV 100.0 97.1 97.7 95.7 94.1 95.1  PLT 67* 73* 67* 78* 70* 44*    Medications:    . benzonatate  100 mg Oral TID  . feeding supplement (ENSURE COMPLETE)  237 mL Oral BID BM  . folic acid  1 mg Oral Daily  . lactulose  20 g Oral BID  . multivitamin with minerals  1 tablet Oral Daily  . ondansetron  4 mg Intravenous Once  . pantoprazole  40 mg Oral Daily  . thiamine  100 mg Oral Daily  Elmarie Shiley, MD 01/01/2014, 10:43 AM

## 2014-01-01 NOTE — Plan of Care (Signed)
Problem: Phase III Progression Outcomes Goal: IV/normal saline lock discontinued Outcome: Completed/Met Date Met:  01/01/14 Goal: Discharge plan remains appropriate-arrangements made Outcome: Completed/Met Date Met:  01/01/14 Goal: Other Phase III Outcomes/Goals Outcome: Not Applicable Date Met:  82/70/78  Problem: Discharge Progression Outcomes Goal: Discharge plan in place and appropriate Outcome: Completed/Met Date Met:  01/01/14 Goal: Pain controlled with appropriate interventions Outcome: Completed/Met Date Met:  01/01/14 Goal: Hemodynamically stable Outcome: Completed/Met Date Met:  67/54/49 Goal: Complications resolved/controlled Outcome: Completed/Met Date Met:  01/01/14 Goal: Tolerating diet Outcome: Completed/Met Date Met:  01/01/14 Goal: Activity appropriate for discharge plan Outcome: Completed/Met Date Met:  01/01/14 Pt going home with daughter.

## 2014-01-01 NOTE — Progress Notes (Signed)
Dede Query Geil to be D/C'd Home per MD order.  Discussed with the patient and all questions fully answered.    Medication List    STOP taking these medications        BC FAST PAIN RELIEF [phone] MG Pack  Generic drug:  Aspirin-Salicylamide-Caffeine      TAKE these medications        feeding supplement (ENSURE COMPLETE) Liqd  Take 237 mLs by mouth 2 (two) times daily between meals.     folic acid 1 MG tablet  Commonly known as:  FOLVITE  Take 1 tablet (1 mg total) by mouth daily.     lactulose 10 GM/15ML solution  Commonly known as:  CHRONULAC  Take 30 mLs (20 g total) by mouth 2 (two) times daily.     ondansetron 4 MG tablet  Commonly known as:  ZOFRAN  Take 1 tablet (4 mg total) by mouth every 6 (six) hours as needed for nausea.     pantoprazole 40 MG tablet  Commonly known as:  PROTONIX  Take 1 tablet (40 mg total) by mouth 2 (two) times daily.     thiamine 100 MG tablet  Take 1 tablet (100 mg total) by mouth daily.        VVS, Skin clean, dry and intact without evidence of skin break down, no evidence of skin tears noted. IV catheter discontinued intact. Site without signs and symptoms of complications. Dressing and pressure applied.  An After Visit Summary was printed and given to the patient.  D/c education completed with patient/family including follow up instructions, medication list, d/c activities limitations if indicated, with other d/c instructions as indicated by MD - patient able to verbalize understanding, all questions fully answered.   Patient instructed to return to ED, call 911, or call MD for any changes in condition.   Patient escorted via [place], and D/C home via private auto.  Audria Nine F 01/01/2014 12:43 PM

## 2014-01-01 NOTE — Plan of Care (Signed)
Problem: Discharge Progression Outcomes Goal: Other Discharge Outcomes/Goals Outcome: Not Applicable Date Met:  01/01/14     

## 2014-01-01 NOTE — Discharge Summary (Signed)
[name], is a 68 y.o. female  DOB [birth year]  MRN [id number].  Admission date:  12/27/2013  Admitting Physician  Berle Mull, MD  Discharge Date:  01/01/2014   Primary MD  No primary care provider on file.  Recommendations for primary care physician for things to follow:   Check CBC, CMP and a 2 view chest x-ray in 7-10 days. Follow liver biopsy results.  Needs outpatient GI follow-up follow-up on liver biopsy results which are pending   Admission Diagnosis  Abdominal mass, RUQ (right upper quadrant) [R19.01] Abdominal pain [R10.9]   Discharge Diagnosis  Abdominal mass, RUQ (right upper quadrant) [R19.01] Abdominal pain [R10.9]     Principal Problem:   AKI (acute kidney injury) Active Problems:   Obstructive cholestatic liver disease   Bicytopenia   Abdominal pain   Decrease in appetite   Ascites   Acute on chronic renal failure   Thrombocytopenia   Alcohol abuse   Transaminasemia   Hepatomegaly      Past Medical History  Diagnosis Date  . Cirrhosis 12/2013    Past Surgical History  Procedure Laterality Date  . Cholecystectomy    . Orif ankle fracture bimalleolar Left 2003       History of present illness and  Hospital Course:     Kindly see H&P for history of present illness and admission details, please review complete Labs, Consult reports and Test reports for all details in brief  HPI  from the history and physical done on the day of admission  [name] is a 68 y.o. female with Past medical history of cholecystectomy.  The patient is presenting with complaints of abdominal pain. She mentions that the pain is primarily located on the lower quadrant. At the time of my evaluation the pain was completely resolved.  While she was here in the ER she started having complaints  of nausea and vomiting and had3 episodes of vomiting without any blood. She mentions at home she has been having decreased appetite since last one week. She has noted that her upper abdomen has been swelling over last 1 month and has been hard. She denies any fever or chills. She denies any sick contacts or any changes in her diet or any herbal supplements or taking a lot of Tylenol. She denies drinking a lot of alcohol. She denies use of drugs. She denies any family history of liver disease. She denies any prior knowledge of any liver disease. She does not follow up with physician on a regular basis.    Hospital Course   Acute on chronic renal failure ( CKD 3) -Likely due to volume depletion, no hydronephrosis, improving with IV fluids. Renal following.  - Still has metabolic acidosis. Bicarbonate per renal if needed.    Abdominal pain with vomiting/Tranasminasemia/Ascites -Suspect the patient has degree of esophagitis/gastritis from her alcohol use as well as NSAIDs. -CT abdomen and pelvis showed small amount of perihepatic ascites as well as pelvic ascites with cirrhotic level,not enough ascites for paracentesis,  currently off antibiotics, hepatitis serology negative, GI following, ANA, AMA, ASMA negative.   She underwent Trans Jugular liver biopsy , results pending results will need outpatient GI follow-up.    Leukocytosis -likely reactionary, afebrile monitor by PCP. -UA--no pyruia, paracentesis--cannot be done due to too little fluid, chest x-ray negative for infiltrates, c.diff negative    Alcohol abuse -Counseled to quit, no signs of DTs, folic acid thiamine upon DC.   Thrombocytopenia -Likely due to the patient's chronic alcohol use and cirrhosis of liver,no signs of bleed. HIV and hepatitis serology negative    Macrocytic anemia -appreciate GI followup, H/O esophagitis and duodenitis, One out of 2 Hemoccult-positive stool. Status post 2 units of packed RBC  transfusion on 12/30/2013 with stable H&H.  -Further workup per GI. Will require outpatient GI follow-up.    Hypoglycemia -AM random cortisol stable 18.7, likely due to poor oral intake. Stable glucose > 48hrs, PO intake better.    CBG (last 3)   Recent Labs  12/30/13 1217  GLUCAP 107*       Discharge Condition: Stable   Follow UP  Follow-up Information    Follow up with Delfin Edis, MD On 01/13/2014.   Specialty:  Gastroenterology   Why:  Appointment is at [place] information:   [address] [place] [address] [phone]       Follow up with [facility]    . Schedule an appointment as soon as possible for a visit in 1 week.   Contact information:   [address]  [place] [address] [phone]        Discharge Instructions  and  Discharge Medications      Discharge Instructions    Diet - low sodium heart healthy    Complete by:  As directed      Discharge instructions    Complete by:  As directed   Follow with Primary MD  in 7 days   Get CBC, CMP, 2 view Chest X ray checked  by Primary MD next visit.    Activity: As tolerated with Full fall precautions use walker/cane & assistance as needed   Disposition Home     Diet: Heart Healthy    For Heart failure patients - Check your Weight same time everyday, if you gain over 2 pounds, or you develop in leg swelling, experience more shortness of breath or chest pain, call your Primary MD immediately. Follow Cardiac Low Salt Diet and 1.8 lit/day fluid restriction.   On your next visit with your primary care physician please Get Medicines reviewed and adjusted.   Please request your Prim.MD to go over all Hospital Tests and Procedure/Radiological results at the follow up, please get all Hospital records sent to your Prim MD by signing hospital release before you go home.   If you experience worsening of your admission symptoms,  develop shortness of breath, life threatening emergency, suicidal or homicidal thoughts you must seek medical attention immediately by calling 911 or calling your MD immediately  if symptoms less severe.  You Must read complete instructions/literature along with all the possible adverse reactions/side effects for all the Medicines you take and that have been prescribed to you. Take any new Medicines after you have completely understood and accpet all the possible adverse reactions/side effects.   Do not drive, operating heavy machinery, perform activities at heights, swimming or participation in water activities or provide baby sitting services if your were admitted for  syncope or siezures until you have seen by Primary MD or a Neurologist and advised to do so again.  Do not drive when taking Pain medications.    Do not take more than prescribed Pain, Sleep and Anxiety Medications  Special Instructions: If you have smoked or chewed Tobacco  in the last 2 yrs please stop smoking, stop any regular Alcohol  and or any Recreational drug use.  Wear Seat belts while driving.   Please note  You were cared for by a hospitalist during your hospital stay. If you have any questions about your discharge medications or the care you received while you were in the hospital after you are discharged, you can call the unit and asked to speak with the hospitalist on call if the hospitalist that took care of you is not available. Once you are discharged, your primary care physician will handle any further medical issues. Please note that NO REFILLS for any discharge medications will be authorized once you are discharged, as it is imperative that you return to your primary care physician (or establish a relationship with a primary care physician if you do not have one) for your aftercare needs so that they can reassess your need for medications and monitor your lab values.     Increase activity slowly    Complete by:   As directed             Medication List    STOP taking these medications        BC FAST PAIN RELIEF [id number] MG Pack  Generic drug:  Aspirin-Salicylamide-Caffeine      TAKE these medications        feeding supplement (ENSURE COMPLETE) Liqd  Take 237 mLs by mouth 2 (two) times daily between meals.     folic acid 1 MG tablet  Commonly known as:  FOLVITE  Take 1 tablet (1 mg total) by mouth daily.     lactulose 10 GM/15ML solution  Commonly known as:  CHRONULAC  Take 30 mLs (20 g total) by mouth 2 (two) times daily.     ondansetron 4 MG tablet  Commonly known as:  ZOFRAN  Take 1 tablet (4 mg total) by mouth every 6 (six) hours as needed for nausea.     pantoprazole 40 MG tablet  Commonly known as:  PROTONIX  Take 1 tablet (40 mg total) by mouth 2 (two) times daily.     thiamine 100 MG tablet  Take 1 tablet (100 mg total) by mouth daily.          Diet and Activity recommendation: See Discharge Instructions above   Consults obtained -  GI, IR   Major procedures and Radiology Reports - PLEASE review detailed and final reports for all details, in brief -     IR - trans Jugular liver biopsy, CT scan abdomen and pelvis, right upper quadrant ultrasound   Ct Abdomen Pelvis Wo Contrast  12/27/2013   CLINICAL DATA:  Lower abdominal pain for 10 days, status postcholecystectomy  EXAM: CT ABDOMEN AND PELVIS WITHOUT CONTRAST  TECHNIQUE: Multidetector CT imaging of the abdomen and pelvis was performed following the standard protocol without IV contrast.  COMPARISON:  07/28/2007.  FINDINGS: Lung bases are unremarkable. Sagittal images of the spine are unremarkable. There is small perihepatic ascites. There is hepatomegaly. The liver measures at least 22 cm in length. There is micro nodular liver contour highly suspicious for chronic liver disease or early cirrhosis.  The study is limited  without IV contrast. The patient is status postcholecystectomy.  Atherosclerotic  calcifications of abdominal aorta and iliac arteries. Unenhanced pancreas and spleen is unremarkable. The spleen measures 8.9 cm in length.  Adrenal glands are unremarkable. Unenhanced kidneys are symmetrical in size. There is a probable cyst in midpole of the right kidney measures 2 cm. No nephrolithiasis. No hydronephrosis or hydroureter. No calcified ureteral calculi are noted. Small ascites is noted in right paracolic gutter.  Small pelvic ascites. Oral contrast material was given to the patient. No small bowel obstruction. No colonic obstruction. Contrast material noted all the way in rectosigmoid colon.  There is small left inguinal hernia containing ascites measures about 1.2 cm. There is old fracture deformity of the left inferior pubic ramus.  The uterus is not identified.  No inguinal adenopathy.  IMPRESSION: 1. There is perihepatic ascites. Significant hepatomegaly. There is micro nodular liver contour. Chronic liver disease or cirrhosis cannot be excluded. Clinical correlation is necessary. 2. The patient is status post cholecystectomy.  No splenomegaly. 3. There is a cyst in midpole of the right kidney measures 2 cm. No nephrolithiasis. No hydronephrosis or hydroureter. 4. Atherosclerotic calcifications of abdominal aorta and iliac arteries. 5. Small pelvic ascites. The uterus is not identified. No small bowel or colonic obstruction.   Electronically Signed   By: Lahoma Crocker M.D.   On: 12/27/2013 18:31   Dg Chest 2 View  12/27/2013   CLINICAL DATA:  Lower abdominal pain and lower extremity swelling  EXAM: CHEST  2 VIEW  COMPARISON:  11/02/2007  FINDINGS: Cardiac shadow is within normal limits. The lungs are hyperaerated bilaterally. No focal infiltrate or sizable effusion is seen.  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   Electronically Signed   By: Inez Catalina M.D.   On: 12/27/2013 16:16   US Abdomen Complete  12/28/2013   CLINICAL DATA:  Elevated liver function tests. Acute renal  insufficiency. Ethanol abuse.  EXAM: ULTRASOUND ABDOMEN COMPLETE  COMPARISON:  12/27/2013  FINDINGS: Gallbladder: Surgically absent  Common bile duct: Diameter: Normal, 5 mm.  Liver: Moderate cirrhosis. Heterogeneous echogenicity is likely related to cirrhosis and possibly heterogeneous steatosis.  IVC: No abnormality visualized.  Pancreas: Visualized portion unremarkable.  Spleen: Size and appearance within normal limits.  Right Kidney: Length: 7.4 cm. Atrophic with increased echogenicity. No hydronephrosis. 1.9 cm interpolar cyst.  Left Kidney: Length: 7.8 cm. Atrophic and hyperechoic. 1.4 cm lower pole cyst.  Abdominal aorta: Atherosclerosis throughout the abdominal aorta.  Other findings: Small volume perihepatic ascites.  IMPRESSION: 1. Cirrhosis and small volume ascites. 2. Atrophic kidneys, most consistent with medical renal disease. 3.  Cholecystectomy, without biliary ductal dilatation.   Electronically Signed   By: Abigail Miyamoto M.D.   On: 12/28/2013 16:12   Ir Venogram Hepatic W Hemodynamic Evaluation  12/31/2013   INDICATION: New diagnosis of cirrhosis. Now with ascites. Please perform transjugular liver biopsy for tissue diagnostic purposes.  EXAM: FLUOROSCOPIC GUIDED TRANSJUGULAR LIVER BIOPSY  ULTRASOUND GUIDANCE FOR VENOUS ACCESS  COMPARISON:  CT abdomen pelvis -12/27/2013  MEDICATIONS: Fentanyl 50 mcg IV; Versed 1 mg IV  CONTRAST:  None.  CO2 was utilized given renal insufficiency.  ANESTHESIA/SEDATION: Total Moderate Sedation Time  30 minutes  FLUOROSCOPY TIME:  2 minutes.  36 seconds.  COMPLICATIONS: None immediate  TECHNIQUE: Informed written consent was obtained from the patient after a discussion of the risks, benefits and alternatives to treatment. Questions regarding the procedure were encouraged and answered. A timeout was performed prior to the initiation of  the procedure.  The right neck was prepped and draped in the usual sterile fashion, and a sterile drape was applied covering the  operative field. Maximum barrier sterile technique with sterile gowns and gloves were used for the procedure. A timeout was performed prior to the initiation of the procedure. Local anesthesia was provided with 1% lidocaine with epinephrine.  The right internal jugular vein was accessed with a micropuncture kit under direct ultrasound guidance. An ultrasound image was saved for documentation purposes. The micropuncture sheath was exchanged for a 9-French vascular sheath over a Benson wire. A 100 cm Bernstein catheter was advanced over a stiff Glidewire to select the middle hepatic vein. A CO2 hepatic venogram was performed. Free hepatic and right atrial pressures were obtained with the Berenstein catheter. The Berenstein catheter was exchanged for a Dextera transjugular biopsy sheath. 3 core needle biopsies were obtained and anterior to the middle hepatic vein under direct fluoroscopic guidance. Samples were deemed adequate, placed in formalin and submitted to pathology. At this point the procedure was terminated. All wires, catheters and sheaths removed and the patient. Hemostasis was achieved at the right neck access site with manual compression. A dressing was placed. The patient tolerated the procedure well without immediate postprocedural complication.  FINDINGS: Normal hepatic venogram.  Pressure measurements as follows:  Free hepatic  12/5 (mean - 8)  Wedged hepatic  24/7 (mean - 17)  Gradient  9  Successful acquisition of 3 core needle biopsies anterior to the middle hepatic vein.  IMPRESSION: 1. No portosystemic gradient to suggest portal venous hypertension. 2. Successful fluoroscopic guided transjugular liver biopsy. 3. Unremarkable hepatic CO2 venogram.   Electronically Signed   By: Sandi Mariscal M.D.   On: 12/31/2013 11:44   Ir Transcatheter Bx  12/31/2013   INDICATION: New diagnosis of cirrhosis. Now with ascites. Please perform transjugular liver biopsy for tissue diagnostic purposes.  EXAM:  FLUOROSCOPIC GUIDED TRANSJUGULAR LIVER BIOPSY  ULTRASOUND GUIDANCE FOR VENOUS ACCESS  COMPARISON:  CT abdomen pelvis -12/27/2013  MEDICATIONS: Fentanyl 50 mcg IV; Versed 1 mg IV  CONTRAST:  None.  CO2 was utilized given renal insufficiency.  ANESTHESIA/SEDATION: Total Moderate Sedation Time  30 minutes  FLUOROSCOPY TIME:  2 minutes.  36 seconds.  COMPLICATIONS: None immediate  TECHNIQUE: Informed written consent was obtained from the patient after a discussion of the risks, benefits and alternatives to treatment. Questions regarding the procedure were encouraged and answered. A timeout was performed prior to the initiation of the procedure.  The right neck was prepped and draped in the usual sterile fashion, and a sterile drape was applied covering the operative field. Maximum barrier sterile technique with sterile gowns and gloves were used for the procedure. A timeout was performed prior to the initiation of the procedure. Local anesthesia was provided with 1% lidocaine with epinephrine.  The right internal jugular vein was accessed with a micropuncture kit under direct ultrasound guidance. An ultrasound image was saved for documentation purposes. The micropuncture sheath was exchanged for a 9-French vascular sheath over a Benson wire. A 100 cm Bernstein catheter was advanced over a stiff Glidewire to select the middle hepatic vein. A CO2 hepatic venogram was performed. Free hepatic and right atrial pressures were obtained with the Berenstein catheter. The Berenstein catheter was exchanged for a Dextera transjugular biopsy sheath. 3 core needle biopsies were obtained and anterior to the middle hepatic vein under direct fluoroscopic guidance. Samples were deemed adequate, placed in formalin and submitted to pathology. At this point the  procedure was terminated. All wires, catheters and sheaths removed and the patient. Hemostasis was achieved at the right neck access site with manual compression. A dressing was  placed. The patient tolerated the procedure well without immediate postprocedural complication.  FINDINGS: Normal hepatic venogram.  Pressure measurements as follows:  Free hepatic  12/5 (mean - 8)  Wedged hepatic  24/7 (mean - 17)  Gradient  9  Successful acquisition of 3 core needle biopsies anterior to the middle hepatic vein.  IMPRESSION: 1. No portosystemic gradient to suggest portal venous hypertension. 2. Successful fluoroscopic guided transjugular liver biopsy. 3. Unremarkable hepatic CO2 venogram.   Electronically Signed   By: Sandi Mariscal M.D.   On: 12/31/2013 11:44   Ir US Guide Vasc Access Right  12/31/2013   INDICATION: New diagnosis of cirrhosis. Now with ascites. Please perform transjugular liver biopsy for tissue diagnostic purposes.  EXAM: FLUOROSCOPIC GUIDED TRANSJUGULAR LIVER BIOPSY  ULTRASOUND GUIDANCE FOR VENOUS ACCESS  COMPARISON:  CT abdomen pelvis -12/27/2013  MEDICATIONS: Fentanyl 50 mcg IV; Versed 1 mg IV  CONTRAST:  None.  CO2 was utilized given renal insufficiency.  ANESTHESIA/SEDATION: Total Moderate Sedation Time  30 minutes  FLUOROSCOPY TIME:  2 minutes.  36 seconds.  COMPLICATIONS: None immediate  TECHNIQUE: Informed written consent was obtained from the patient after a discussion of the risks, benefits and alternatives to treatment. Questions regarding the procedure were encouraged and answered. A timeout was performed prior to the initiation of the procedure.  The right neck was prepped and draped in the usual sterile fashion, and a sterile drape was applied covering the operative field. Maximum barrier sterile technique with sterile gowns and gloves were used for the procedure. A timeout was performed prior to the initiation of the procedure. Local anesthesia was provided with 1% lidocaine with epinephrine.  The right internal jugular vein was accessed with a micropuncture kit under direct ultrasound guidance. An ultrasound image was saved for documentation purposes. The  micropuncture sheath was exchanged for a 9-French vascular sheath over a Benson wire. A 100 cm Bernstein catheter was advanced over a stiff Glidewire to select the middle hepatic vein. A CO2 hepatic venogram was performed. Free hepatic and right atrial pressures were obtained with the Berenstein catheter. The Berenstein catheter was exchanged for a Dextera transjugular biopsy sheath. 3 core needle biopsies were obtained and anterior to the middle hepatic vein under direct fluoroscopic guidance. Samples were deemed adequate, placed in formalin and submitted to pathology. At this point the procedure was terminated. All wires, catheters and sheaths removed and the patient. Hemostasis was achieved at the right neck access site with manual compression. A dressing was placed. The patient tolerated the procedure well without immediate postprocedural complication.  FINDINGS: Normal hepatic venogram.  Pressure measurements as follows:  Free hepatic  12/5 (mean - 8)  Wedged hepatic  24/7 (mean - 17)  Gradient  9  Successful acquisition of 3 core needle biopsies anterior to the middle hepatic vein.  IMPRESSION: 1. No portosystemic gradient to suggest portal venous hypertension. 2. Successful fluoroscopic guided transjugular liver biopsy. 3. Unremarkable hepatic CO2 venogram.   Electronically Signed   By: Sandi Mariscal M.D.   On: 12/31/2013 11:44    Micro Results     Recent Results (from the past 240 hour(s))  Clostridium Difficile by PCR     Status: None   Collection Time: 12/28/13 12:50 PM  Result Value Ref Range Status   C difficile by pcr NEGATIVE NEGATIVE Final  Urine  culture     Status: None   Collection Time: 12/28/13  5:01 PM  Result Value Ref Range Status   Specimen Description URINE, CATHETERIZED  Final   Special Requests NONE  Final   Culture  Setup Time   Final    12/29/2013 01:11 Performed at [facility] Performed at [facility]   Final   Culture NO  GROWTH Performed at [facility]   Final   Report Status 12/30/2013 FINAL  Final       Today   Subjective:   Gilmer Mor today has no headache,no chest abdominal pain,no new weakness tingling or numbness, feels much better wants to go home today.   Objective:   Blood pressure 94/57, pulse 104, temperature 98.3 F (36.8 C), temperature source Oral, resp. rate 20, height _0  (1.651 m), weight 49.986 kg (110 lb 3.2 oz), SpO2 100 %.   Intake/Output Summary (Last 24 hours) at 01/01/14 0916 Last data filed at 12/31/13 1849  Gross per 24 hour  Intake   1352 ml  Output      0 ml  Net   1352 ml    Exam Awake Alert, Oriented x 3, No new F.N deficits, Normal affect [place].AT,PERRAL Supple Neck,No JVD, No cervical lymphadenopathy appriciated.  Symmetrical Chest wall movement, Good air movement bilaterally, CTAB RRR,No Gallops,Rubs or new Murmurs, No Parasternal Heave +ve B.Sounds, Abd Soft, Non tender, No organomegaly appriciated, No rebound -guarding or rigidity. No Cyanosis, Clubbing or edema, No new Rash or bruise  Data Review   CBC w Diff: Lab Results  Component Value Date   WBC 17.1* 01/01/2014   HGB 10.9* 01/01/2014   HCT 33.0* 01/01/2014   PLT 44* 01/01/2014   LYMPHOPCT 5* 12/30/2013   MONOPCT 8 12/30/2013   EOSPCT 2 12/30/2013   BASOPCT 0 12/30/2013    CMP: Lab Results  Component Value Date   NA 140 01/01/2014   K 3.7 01/01/2014   CL 98 01/01/2014   CO2 25 01/01/2014   BUN 48* 01/01/2014   CREATININE 2.90* 01/01/2014   PROT 4.5* 01/01/2014   ALBUMIN 1.1* 01/01/2014   ALBUMIN 1.1* 01/01/2014   BILITOT 3.3* 01/01/2014   ALKPHOS 562* 01/01/2014   AST 57* 01/01/2014   ALT 21 01/01/2014  .   Total Time in preparing paper work, data evaluation and todays exam - 35 minutes  Thurnell Lose M.D on 01/01/2014 at 9:16 AM  [facility] Office  [phone]

## 2014-01-03 LAB — ALPHA-1 ANTITRYPSIN PHENOTYPE: A1 ANTITRYPSIN: 305 mg/dL — AB (ref 83–199)

## 2014-01-04 ENCOUNTER — Inpatient Hospital Stay (HOSPITAL_COMMUNITY)
Admission: EM | Admit: 2014-01-04 | Discharge: 2014-01-09 | DRG: 435 | Disposition: A | Discharge status: Hospice | Payer: Medicare Other | Attending: Internal Medicine | Admitting: Internal Medicine

## 2014-01-04 ENCOUNTER — Encounter (HOSPITAL_COMMUNITY): Payer: Self-pay | Admitting: Emergency Medicine

## 2014-01-04 ENCOUNTER — Emergency Department (HOSPITAL_COMMUNITY): Payer: Medicare Other

## 2014-01-04 DIAGNOSIS — C801 Malignant (primary) neoplasm, unspecified: Secondary | ICD-10-CM | POA: Diagnosis present

## 2014-01-04 DIAGNOSIS — R109 Unspecified abdominal pain: Secondary | ICD-10-CM

## 2014-01-04 DIAGNOSIS — C220 Liver cell carcinoma: Principal | ICD-10-CM | POA: Diagnosis present

## 2014-01-04 DIAGNOSIS — F1010 Alcohol abuse, uncomplicated: Secondary | ICD-10-CM | POA: Diagnosis present

## 2014-01-04 DIAGNOSIS — D6959 Other secondary thrombocytopenia: Secondary | ICD-10-CM | POA: Diagnosis present

## 2014-01-04 DIAGNOSIS — K7469 Other cirrhosis of liver: Secondary | ICD-10-CM | POA: Diagnosis present

## 2014-01-04 DIAGNOSIS — K7290 Hepatic failure, unspecified without coma: Secondary | ICD-10-CM | POA: Diagnosis not present

## 2014-01-04 DIAGNOSIS — R740 Nonspecific elevation of levels of transaminase and lactic acid dehydrogenase [LDH]: Secondary | ICD-10-CM

## 2014-01-04 DIAGNOSIS — Z66 Do not resuscitate: Secondary | ICD-10-CM | POA: Diagnosis not present

## 2014-01-04 DIAGNOSIS — K7682 Hepatic encephalopathy: Secondary | ICD-10-CM

## 2014-01-04 DIAGNOSIS — N186 End stage renal disease: Secondary | ICD-10-CM | POA: Diagnosis present

## 2014-01-04 DIAGNOSIS — F17210 Nicotine dependence, cigarettes, uncomplicated: Secondary | ICD-10-CM | POA: Diagnosis present

## 2014-01-04 DIAGNOSIS — Z681 Body mass index (BMI) 19 or less, adult: Secondary | ICD-10-CM

## 2014-01-04 DIAGNOSIS — N179 Acute kidney failure, unspecified: Secondary | ICD-10-CM

## 2014-01-04 DIAGNOSIS — R627 Adult failure to thrive: Secondary | ICD-10-CM | POA: Diagnosis present

## 2014-01-04 DIAGNOSIS — I509 Heart failure, unspecified: Secondary | ICD-10-CM | POA: Diagnosis present

## 2014-01-04 DIAGNOSIS — K767 Hepatorenal syndrome: Secondary | ICD-10-CM | POA: Diagnosis present

## 2014-01-04 DIAGNOSIS — K7460 Unspecified cirrhosis of liver: Secondary | ICD-10-CM | POA: Diagnosis present

## 2014-01-04 DIAGNOSIS — R7401 Elevation of levels of liver transaminase levels: Secondary | ICD-10-CM | POA: Diagnosis present

## 2014-01-04 DIAGNOSIS — K209 Esophagitis, unspecified: Secondary | ICD-10-CM | POA: Diagnosis present

## 2014-01-04 DIAGNOSIS — D638 Anemia in other chronic diseases classified elsewhere: Secondary | ICD-10-CM | POA: Diagnosis present

## 2014-01-04 DIAGNOSIS — R64 Cachexia: Secondary | ICD-10-CM | POA: Diagnosis present

## 2014-01-04 DIAGNOSIS — D684 Acquired coagulation factor deficiency: Secondary | ICD-10-CM | POA: Diagnosis present

## 2014-01-04 DIAGNOSIS — Z515 Encounter for palliative care: Secondary | ICD-10-CM

## 2014-01-04 DIAGNOSIS — E872 Acidosis, unspecified: Secondary | ICD-10-CM

## 2014-01-04 DIAGNOSIS — E43 Unspecified severe protein-calorie malnutrition: Secondary | ICD-10-CM | POA: Diagnosis present

## 2014-01-04 DIAGNOSIS — R531 Weakness: Secondary | ICD-10-CM

## 2014-01-04 DIAGNOSIS — Z79899 Other long term (current) drug therapy: Secondary | ICD-10-CM

## 2014-01-04 DIAGNOSIS — K7200 Acute and subacute hepatic failure without coma: Secondary | ICD-10-CM | POA: Diagnosis present

## 2014-01-04 DIAGNOSIS — N189 Chronic kidney disease, unspecified: Secondary | ICD-10-CM

## 2014-01-04 DIAGNOSIS — E875 Hyperkalemia: Secondary | ICD-10-CM | POA: Diagnosis present

## 2014-01-04 DIAGNOSIS — J9690 Respiratory failure, unspecified, unspecified whether with hypoxia or hypercapnia: Secondary | ICD-10-CM

## 2014-01-04 HISTORY — DX: Chronic obstructive pulmonary disease, unspecified: J44.9

## 2014-01-04 HISTORY — DX: Disorder of kidney and ureter, unspecified: N28.9

## 2014-01-04 LAB — I-STAT CHEM 8, ED
BUN: 69 mg/dL — ABNORMAL HIGH (ref 6–23)
CALCIUM ION: 1.12 mmol/L — AB (ref 1.13–1.30)
CREATININE: 6 mg/dL — AB (ref 0.50–1.10)
Chloride: 98 mEq/L (ref 96–112)
GLUCOSE: 161 mg/dL — AB (ref 70–99)
HCT: 32 % — ABNORMAL LOW (ref 36.0–46.0)
HEMOGLOBIN: 10.9 g/dL — AB (ref 12.0–15.0)
Potassium: 5.1 mEq/L (ref 3.7–5.3)
Sodium: 133 mEq/L — ABNORMAL LOW (ref 137–147)
TCO2: 16 mmol/L (ref 0–100)

## 2014-01-04 LAB — CBG MONITORING, ED
GLUCOSE-CAPILLARY: 15 mg/dL — AB (ref 70–99)
Glucose-Capillary: 11 mg/dL — CL (ref 70–99)

## 2014-01-04 LAB — I-STAT CG4 LACTIC ACID, ED: Lactic Acid, Venous: 11.07 mmol/L — ABNORMAL HIGH (ref 0.5–2.2)

## 2014-01-04 LAB — PRO B NATRIURETIC PEPTIDE: Pro B Natriuretic peptide (BNP): 6795 pg/mL — ABNORMAL HIGH (ref 0–125)

## 2014-01-04 MED ORDER — DEXTROSE 50 % IV SOLN
1.0000 | Freq: Once | INTRAVENOUS | Status: AC
  Administered 2014-01-04: 50 mL via INTRAVENOUS

## 2014-01-04 MED ORDER — SODIUM POLYSTYRENE SULFONATE 15 GM/60ML PO SUSP
30.0000 g | Freq: Once | ORAL | Status: AC
  Administered 2014-01-04: 30 g via ORAL
  Filled 2014-01-04: qty 120

## 2014-01-04 MED ORDER — DEXTROSE 50 % IV SOLN
INTRAVENOUS | Status: AC
  Administered 2014-01-04: 50 mL via INTRAVENOUS
  Filled 2014-01-04: qty 50

## 2014-01-04 MED ORDER — SODIUM BICARBONATE 8.4 % IV SOLN
50.0000 meq | Freq: Once | INTRAVENOUS | Status: AC
  Administered 2014-01-04: 50 meq via INTRAVENOUS
  Filled 2014-01-04: qty 50

## 2014-01-04 MED ORDER — VANCOMYCIN HCL IN DEXTROSE 1-5 GM/200ML-% IV SOLN
1000.0000 mg | Freq: Once | INTRAVENOUS | Status: AC
  Administered 2014-01-05: 1000 mg via INTRAVENOUS
  Filled 2014-01-04: qty 200

## 2014-01-04 MED ORDER — SODIUM CHLORIDE 0.9 % IV BOLUS (SEPSIS)
500.0000 mL | Freq: Once | INTRAVENOUS | Status: AC
  Administered 2014-01-04: 500 mL via INTRAVENOUS

## 2014-01-04 MED ORDER — PIPERACILLIN-TAZOBACTAM IN DEX 2-0.25 GM/50ML IV SOLN
2.2500 g | Freq: Once | INTRAVENOUS | Status: AC
  Administered 2014-01-05: 2.25 g via INTRAVENOUS
  Filled 2014-01-04: qty 50

## 2014-01-04 MED ORDER — CALCIUM GLUCONATE 10 % IV SOLN
1.0000 g | Freq: Once | INTRAVENOUS | Status: DC
  Filled 2014-01-04: qty 10

## 2014-01-04 NOTE — ED Notes (Signed)
Dr. Darl Householder made aware of hypoglycemia - VO given to administer 1 amp D50.

## 2014-01-04 NOTE — ED Notes (Signed)
Patient transported to X-ray 

## 2014-01-04 NOTE — ED Notes (Signed)
Pt brought to the ED from home by [facility] c/o weakness, ABD distention and bilateral lower extremity edema, pt was here last Thursday for same symptoms. Pt denies any pain, n/v, states is been having intermittent episodes of diarrhea. Pt is on Protonix at home

## 2014-01-04 NOTE — ED Provider Notes (Signed)
CSN: [id number]     Arrival date & time 01/04/14  2125 History  This chart was scribed for Wandra Arthurs, MD by Rayfield Citizen, ED Scribe. This patient was seen in room B18C/B18C and the patient's care was started at 11:02 PM.     Chief Complaint  Patient presents with  . Leg Swelling  . Weakness    The history is provided by a relative. No language interpreter was used.    HPI Comments: [name] is a 68 y.o. female who presents to the Emergency Department complaining of generalized weakness. Patient was recently discharged (01/01/14). Daughter reports that patient was not eating or drinking as normal; she is "not herself," and "very swollen." Daughter notes that patient's swelling worsened and the patient is having difficulty ambulating. Patient has not been urinating as normal; very little urine today, if any. Her body temperature has been running low. Daughter reports that patient appears SOB to her. She denies vomiting, fevers or chills. She was recently admitted for acute renal failure, new onset cirrhosis, liver failure. Had liver biopsy done in the hospital, d/c home 2 days ago.    Past Medical History  Diagnosis Date  . Cirrhosis 12/2013  . Renal disorder    Past Surgical History  Procedure Laterality Date  . Cholecystectomy    . Orif ankle fracture bimalleolar Left 2003   History reviewed. No pertinent family history. History  Substance Use Topics  . Smoking status: Current Every Day Smoker -- 1.00 packs/day for 50 years    Types: Cigarettes  . Smokeless tobacco: Current User  . Alcohol Use: Yes   OB History    No data available     Review of Systems  Constitutional: Positive for fatigue.  Respiratory: Positive for shortness of breath.   Cardiovascular: Positive for leg swelling.  Genitourinary: Positive for decreased urine volume.  Neurological: Positive for weakness.  All other systems reviewed and are negative.     Allergies  Percocet and  Vicodin  Home Medications   Prior to Admission medications   Medication Sig Start Date End Date Taking? Authorizing Provider  feeding supplement, ENSURE COMPLETE, (ENSURE COMPLETE) LIQD Take 237 mLs by mouth 2 (two) times daily between meals. 01/01/14  Yes Thurnell Lose, MD  folic acid (FOLVITE) 1 MG tablet Take 1 tablet (1 mg total) by mouth daily. 01/01/14  Yes Thurnell Lose, MD  lactulose (CHRONULAC) 10 GM/15ML solution Take 30 mLs (20 g total) by mouth 2 (two) times daily. 01/01/14  Yes Thurnell Lose, MD  pantoprazole (PROTONIX) 40 MG tablet Take 1 tablet (40 mg total) by mouth 2 (two) times daily. 01/01/14  Yes Thurnell Lose, MD  thiamine 100 MG tablet Take 1 tablet (100 mg total) by mouth daily. 01/01/14  Yes Thurnell Lose, MD  ondansetron (ZOFRAN) 4 MG tablet Take 1 tablet (4 mg total) by mouth every 6 (six) hours as needed for nausea. Patient not taking: Reported on 01/04/2014 01/01/14   Thurnell Lose, MD   BP 99/63 mmHg  Pulse 97  Temp(Src) 97.8 F (36.6 C) (Oral)  Resp 22  Ht 5\' 5"  (1.651 m)  Wt 110 lb (49.896 kg)  BMI 18.31 kg/m2  SpO2 93% Physical Exam  Constitutional: She is oriented to person, place, and time. She appears well-developed and well-nourished.  Tired, ill-appearing  HENT:  Head: Normocephalic and atraumatic.  Mucous membranes moist  Eyes: Scleral icterus is present.  Neck: No tracheal deviation present.  Cardiovascular: Normal rate, regular rhythm and normal heart sounds.   Pulmonary/Chest: Effort normal.  Diminished breath sounds bilateral bases  Abdominal: Soft. There is tenderness (Mild diffuse tenderness with fluid wave).  Musculoskeletal: She exhibits edema.  2+ edema bilateral legs  Neurological: She is oriented to person, place, and time.  Tired, moving all extremities  Skin: Skin is warm and dry.  Psychiatric: She has a normal mood and affect. Her behavior is normal.  Nursing note and vitals reviewed.   ED Course   Procedures   DIAGNOSTIC STUDIES: Oxygen Saturation is 94% on RA, low by my interpretation.    COORDINATION OF CARE: 11:07 PM Discussed treatment plan with pt at bedside and pt agreed to plan.  CRITICAL CARE Performed by: Darl Householder, Laina Guerrieri   Total critical care time: 30 min   Critical care time was exclusive of separately billable procedures and treating other patients.  Critical care was necessary to treat or prevent imminent or life-threatening deterioration.  Critical care was time spent personally by me on the following activities: development of treatment plan with patient and/or surrogate as well as nursing, discussions with consultants, evaluation of patient's response to treatment, examination of patient, obtaining history from patient or surrogate, ordering and performing treatments and interventions, ordering and review of laboratory studies, ordering and review of radiographic studies, pulse oximetry and re-evaluation of patient's condition.   Labs Review Labs Reviewed  CBC WITH DIFFERENTIAL - Abnormal; Notable for the following:    WBC 15.3 (*)    RBC 3.49 (*)    Hemoglobin 11.1 (*)    HCT 34.1 (*)    RDW 21.4 (*)    Platelets 108 (*)    Neutrophils Relative % 85 (*)    Lymphocytes Relative 4 (*)    Neutro Abs 13.0 (*)    Lymphs Abs 0.6 (*)    Monocytes Absolute 1.7 (*)    All other components within normal limits  AMMONIA - Abnormal; Notable for the following:    Ammonia 85 (*)    All other components within normal limits  PRO B NATRIURETIC PEPTIDE - Abnormal; Notable for the following:    Pro B Natriuretic peptide (BNP) 6795.0 (*)    All other components within normal limits  BASIC METABOLIC PANEL - Abnormal; Notable for the following:    Sodium 135 (*)    Potassium 5.5 (*)    Chloride 92 (*)    CO2 15 (*)    Glucose, Bld 162 (*)    BUN 78 (*)    Creatinine, Ser 5.29 (*)    GFR calc non Af Amer 8 (*)    GFR calc Af Amer 9 (*)    Anion gap 28 (*)    All  other components within normal limits  HEPATIC FUNCTION PANEL - Abnormal; Notable for the following:    Total Protein 4.4 (*)    Albumin 1.0 (*)    AST 98 (*)    Alkaline Phosphatase 400 (*)    Total Bilirubin 3.7 (*)    Bilirubin, Direct 3.1 (*)    All other components within normal limits  I-STAT CHEM 8, ED - Abnormal; Notable for the following:    Sodium 133 (*)    BUN 69 (*)    Creatinine, Ser 6.00 (*)    Glucose, Bld 161 (*)    Calcium, Ion 1.12 (*)    Hemoglobin 10.9 (*)    HCT 32.0 (*)    All other components within normal limits  I-STAT CG4 LACTIC ACID, ED - Abnormal; Notable for the following:    Lactic Acid, Venous 11.07 (*)    All other components within normal limits  CBG MONITORING, ED - Abnormal; Notable for the following:    Glucose-Capillary 15 (*)    All other components within normal limits  CBG MONITORING, ED - Abnormal; Notable for the following:    Glucose-Capillary 11 (*)    All other components within normal limits  CULTURE, BLOOD (ROUTINE X 2)  CULTURE, BLOOD (ROUTINE X 2)  URINALYSIS, ROUTINE W REFLEX MICROSCOPIC  BASIC METABOLIC PANEL  HEPATIC FUNCTION PANEL  CBG MONITORING, ED    Imaging Review Dg Abd Acute W/chest  01/05/2014   CLINICAL DATA:  Abdominal pain.  History of renal disorder  EXAM: ACUTE ABDOMEN SERIES (ABDOMEN 2 VIEW & CHEST 1 VIEW)  COMPARISON:  None.  FINDINGS: No abnormal bowel dilatation identified. Cholecystectomy clips noted in the right upper quadrant. The heart size appears normal. There are small bilateral pleural effusions and mild interstitial edema.  IMPRESSION: 1. Congestive heart failure. 2. Nonobstructive bowel gas pattern.   Electronically Signed   By: Kerby Moors M.D.   On: 01/05/2014 00:12     EKG Interpretation   Date/Time:  Sunday January 04 2014 23:30:40 EST Ventricular Rate:  96 PR Interval:  168 QRS Duration: 68 QT Interval:  396 QTC Calculation: 500 R Axis:   62 Text Interpretation:  Sinus rhythm  Paired ventricular premature complexes  Aberrant conduction of SV complex(es) Low voltage, extremity and  precordial leads Prolonged QT interval QT prolonged compared to previous   Confirmed by Melaney Tellefsen  MD, Kynnadi Dicenso ([id number]) on 01/04/2014 11:33:55 PM      MDM   Final diagnoses:  Abdominal pain   [name] is a 68 y.o. female here with shortness of breath, weakness, leg swelling. Concerned for new onset CHF vs worsening liver or renal failure. Will get ammonia to r/o hepatic encephalopathy. She is hypothermic and hypotensive, so I am concerned for sepsis. Will do sepsis workup.   11:20 PM Lactate 11.  WBC 15. Code sepsis initiated. Ordered vanc/zosyn. K 8 on istat. Will check EKG, give bicarb, kayexelate.  12 AM Tech came to me regarding istat chem 8. They think the previous K might be an error. Repeat was 5.1. But Cr 6.0. Bicarb, kayexelate already given. CMP pending.   12:44 AM BMP showed K 5.5 after given meds. Cr 5.9. Bicarb 15. CXR showed CHF. She appears volume overloaded. She is acidotic, hyperkalemic, volume overloaded. Consider emergent dialysis. Called Dr. Melvia Heaps, who recommend diuresis overnight and he will see patient in AM. However, BP in the 90s so I want to hold off on lasix. I called critical care, who will admit for hyperkalemia, new onset CHF, acute renal failure, severe sepsis, hepatic encephalopathy.   I personally performed the services described in this documentation, which was scribed in my presence. The recorded information has been reviewed and is accurate.    Wandra Arthurs, MD 01/05/14 [phone]

## 2014-01-04 NOTE — ED Notes (Signed)
[facility] contacted for Sepsis Level 2

## 2014-01-05 ENCOUNTER — Encounter (HOSPITAL_COMMUNITY): Payer: Self-pay | Admitting: *Deleted

## 2014-01-05 ENCOUNTER — Telehealth: Payer: Self-pay | Admitting: Emergency Medicine

## 2014-01-05 ENCOUNTER — Inpatient Hospital Stay: Payer: Medicare Other

## 2014-01-05 DIAGNOSIS — D649 Anemia, unspecified: Secondary | ICD-10-CM

## 2014-01-05 DIAGNOSIS — R6521 Severe sepsis with septic shock: Secondary | ICD-10-CM

## 2014-01-05 DIAGNOSIS — N179 Acute kidney failure, unspecified: Secondary | ICD-10-CM

## 2014-01-05 DIAGNOSIS — K209 Esophagitis, unspecified: Secondary | ICD-10-CM | POA: Diagnosis present

## 2014-01-05 DIAGNOSIS — D696 Thrombocytopenia, unspecified: Secondary | ICD-10-CM

## 2014-01-05 DIAGNOSIS — D6959 Other secondary thrombocytopenia: Secondary | ICD-10-CM | POA: Diagnosis present

## 2014-01-05 DIAGNOSIS — N186 End stage renal disease: Secondary | ICD-10-CM | POA: Diagnosis present

## 2014-01-05 DIAGNOSIS — F17210 Nicotine dependence, cigarettes, uncomplicated: Secondary | ICD-10-CM | POA: Diagnosis present

## 2014-01-05 DIAGNOSIS — R627 Adult failure to thrive: Secondary | ICD-10-CM

## 2014-01-05 DIAGNOSIS — F1010 Alcohol abuse, uncomplicated: Secondary | ICD-10-CM | POA: Diagnosis present

## 2014-01-05 DIAGNOSIS — I369 Nonrheumatic tricuspid valve disorder, unspecified: Secondary | ICD-10-CM

## 2014-01-05 DIAGNOSIS — N178 Other acute kidney failure: Secondary | ICD-10-CM

## 2014-01-05 DIAGNOSIS — Z515 Encounter for palliative care: Secondary | ICD-10-CM | POA: Diagnosis not present

## 2014-01-05 DIAGNOSIS — K7200 Acute and subacute hepatic failure without coma: Secondary | ICD-10-CM | POA: Diagnosis present

## 2014-01-05 DIAGNOSIS — C220 Liver cell carcinoma: Secondary | ICD-10-CM | POA: Diagnosis present

## 2014-01-05 DIAGNOSIS — I509 Heart failure, unspecified: Secondary | ICD-10-CM | POA: Diagnosis present

## 2014-01-05 DIAGNOSIS — C801 Malignant (primary) neoplasm, unspecified: Secondary | ICD-10-CM

## 2014-01-05 DIAGNOSIS — K7469 Other cirrhosis of liver: Secondary | ICD-10-CM | POA: Diagnosis present

## 2014-01-05 DIAGNOSIS — D72829 Elevated white blood cell count, unspecified: Secondary | ICD-10-CM

## 2014-01-05 DIAGNOSIS — E43 Unspecified severe protein-calorie malnutrition: Secondary | ICD-10-CM | POA: Diagnosis present

## 2014-01-05 DIAGNOSIS — I9589 Other hypotension: Secondary | ICD-10-CM

## 2014-01-05 DIAGNOSIS — K7290 Hepatic failure, unspecified without coma: Secondary | ICD-10-CM | POA: Diagnosis present

## 2014-01-05 DIAGNOSIS — Z66 Do not resuscitate: Secondary | ICD-10-CM | POA: Diagnosis not present

## 2014-01-05 DIAGNOSIS — C229 Malignant neoplasm of liver, not specified as primary or secondary: Secondary | ICD-10-CM

## 2014-01-05 DIAGNOSIS — D684 Acquired coagulation factor deficiency: Secondary | ICD-10-CM | POA: Diagnosis present

## 2014-01-05 DIAGNOSIS — K767 Hepatorenal syndrome: Secondary | ICD-10-CM | POA: Diagnosis present

## 2014-01-05 DIAGNOSIS — Z79899 Other long term (current) drug therapy: Secondary | ICD-10-CM | POA: Diagnosis not present

## 2014-01-05 DIAGNOSIS — R64 Cachexia: Secondary | ICD-10-CM | POA: Diagnosis present

## 2014-01-05 DIAGNOSIS — E875 Hyperkalemia: Secondary | ICD-10-CM | POA: Diagnosis present

## 2014-01-05 DIAGNOSIS — A419 Sepsis, unspecified organism: Secondary | ICD-10-CM

## 2014-01-05 DIAGNOSIS — D638 Anemia in other chronic diseases classified elsewhere: Secondary | ICD-10-CM | POA: Diagnosis present

## 2014-01-05 DIAGNOSIS — Z681 Body mass index (BMI) 19 or less, adult: Secondary | ICD-10-CM | POA: Diagnosis not present

## 2014-01-05 DIAGNOSIS — E46 Unspecified protein-calorie malnutrition: Secondary | ICD-10-CM

## 2014-01-05 LAB — URINE MICROSCOPIC-ADD ON

## 2014-01-05 LAB — BASIC METABOLIC PANEL
Anion gap: 28 — ABNORMAL HIGH (ref 5–15)
BUN: 78 mg/dL — AB (ref 6–23)
CO2: 15 meq/L — AB (ref 19–32)
CREATININE: 5.29 mg/dL — AB (ref 0.50–1.10)
Calcium: 9.4 mg/dL (ref 8.4–10.5)
Chloride: 92 mEq/L — ABNORMAL LOW (ref 96–112)
GFR calc Af Amer: 9 mL/min — ABNORMAL LOW (ref >90)
GFR calc non Af Amer: 8 mL/min — ABNORMAL LOW (ref >90)
GLUCOSE: 162 mg/dL — AB (ref 70–99)
Potassium: 5.5 mEq/L — ABNORMAL HIGH (ref 3.7–5.3)
Sodium: 135 mEq/L — ABNORMAL LOW (ref 137–147)

## 2014-01-05 LAB — HEPATIC FUNCTION PANEL
ALK PHOS: 400 U/L — AB (ref 39–117)
ALT: 23 U/L (ref 0–35)
AST: 98 U/L — AB (ref 0–37)
Albumin: 1 g/dL — ABNORMAL LOW (ref 3.5–5.2)
BILIRUBIN DIRECT: 3.1 mg/dL — AB (ref 0.0–0.3)
BILIRUBIN TOTAL: 3.7 mg/dL — AB (ref 0.3–1.2)
Indirect Bilirubin: 0.6 mg/dL (ref 0.3–0.9)
Total Protein: 4.4 g/dL — ABNORMAL LOW (ref 6.0–8.3)

## 2014-01-05 LAB — CBC WITH DIFFERENTIAL/PLATELET
Basophils Absolute: 0 10*3/uL (ref 0.0–0.1)
Basophils Relative: 0 % (ref 0–1)
EOS PCT: 0 % (ref 0–5)
Eosinophils Absolute: 0 10*3/uL (ref 0.0–0.7)
HCT: 34.1 % — ABNORMAL LOW (ref 36.0–46.0)
Hemoglobin: 11.1 g/dL — ABNORMAL LOW (ref 12.0–15.0)
LYMPHS ABS: 0.6 10*3/uL — AB (ref 0.7–4.0)
Lymphocytes Relative: 4 % — ABNORMAL LOW (ref 12–46)
MCH: 31.8 pg (ref 26.0–34.0)
MCHC: 32.6 g/dL (ref 30.0–36.0)
MCV: 97.7 fL (ref 78.0–100.0)
Monocytes Absolute: 1.7 10*3/uL — ABNORMAL HIGH (ref 0.1–1.0)
Monocytes Relative: 11 % (ref 3–12)
NEUTROS PCT: 85 % — AB (ref 43–77)
Neutro Abs: 13 10*3/uL — ABNORMAL HIGH (ref 1.7–7.7)
Platelets: 108 10*3/uL — ABNORMAL LOW (ref 150–400)
RBC: 3.49 MIL/uL — AB (ref 3.87–5.11)
RDW: 21.4 % — ABNORMAL HIGH (ref 11.5–15.5)
WBC: 15.3 10*3/uL — AB (ref 4.0–10.5)

## 2014-01-05 LAB — CBC
HCT: 26.3 % — ABNORMAL LOW (ref 36.0–46.0)
Hemoglobin: 8.6 g/dL — ABNORMAL LOW (ref 12.0–15.0)
MCH: 31.3 pg (ref 26.0–34.0)
MCHC: 32.7 g/dL (ref 30.0–36.0)
MCV: 95.6 fL (ref 78.0–100.0)
PLATELETS: 59 10*3/uL — AB (ref 150–400)
RBC: 2.75 MIL/uL — ABNORMAL LOW (ref 3.87–5.11)
RDW: 20.4 % — ABNORMAL HIGH (ref 11.5–15.5)
WBC: 14.4 10*3/uL — ABNORMAL HIGH (ref 4.0–10.5)

## 2014-01-05 LAB — COMPREHENSIVE METABOLIC PANEL
ALT: 30 U/L (ref 0–35)
ANION GAP: 24 — AB (ref 5–15)
AST: 127 U/L — ABNORMAL HIGH (ref 0–37)
Albumin: 1.5 g/dL — ABNORMAL LOW (ref 3.5–5.2)
Alkaline Phosphatase: 361 U/L — ABNORMAL HIGH (ref 39–117)
BUN: 78 mg/dL — AB (ref 6–23)
CO2: 22 mEq/L (ref 19–32)
Calcium: 9.4 mg/dL (ref 8.4–10.5)
Chloride: 96 mEq/L (ref 96–112)
Creatinine, Ser: 5.17 mg/dL — ABNORMAL HIGH (ref 0.50–1.10)
GFR calc non Af Amer: 8 mL/min — ABNORMAL LOW (ref >90)
GFR, EST AFRICAN AMERICAN: 9 mL/min — AB (ref >90)
GLUCOSE: 119 mg/dL — AB (ref 70–99)
Potassium: 4.7 mEq/L (ref 3.7–5.3)
Sodium: 142 mEq/L (ref 137–147)
TOTAL PROTEIN: 4.6 g/dL — AB (ref 6.0–8.3)
Total Bilirubin: 3.9 mg/dL — ABNORMAL HIGH (ref 0.3–1.2)

## 2014-01-05 LAB — URINALYSIS, ROUTINE W REFLEX MICROSCOPIC
Glucose, UA: NEGATIVE mg/dL
Hgb urine dipstick: NEGATIVE
KETONES UR: 15 mg/dL — AB
Nitrite: POSITIVE — AB
PH: 5 (ref 5.0–8.0)
Protein, ur: NEGATIVE mg/dL
SPECIFIC GRAVITY, URINE: 1.019 (ref 1.005–1.030)
UROBILINOGEN UA: 0.2 mg/dL (ref 0.0–1.0)

## 2014-01-05 LAB — LACTIC ACID, PLASMA: Lactic Acid, Venous: 7.1 mmol/L — ABNORMAL HIGH (ref 0.5–2.2)

## 2014-01-05 LAB — PHOSPHORUS: Phosphorus: 7.1 mg/dL — ABNORMAL HIGH (ref 2.3–4.6)

## 2014-01-05 LAB — GLUCOSE, CAPILLARY
GLUCOSE-CAPILLARY: 11 mg/dL — AB (ref 70–99)
GLUCOSE-CAPILLARY: 117 mg/dL — AB (ref 70–99)
Glucose-Capillary: 15 mg/dL — CL (ref 70–99)
Glucose-Capillary: 124 mg/dL — ABNORMAL HIGH (ref 70–99)

## 2014-01-05 LAB — SODIUM, URINE, RANDOM: Sodium, Ur: <20 mEq/L

## 2014-01-05 LAB — MAGNESIUM: Magnesium: 2.6 mg/dL — ABNORMAL HIGH (ref 1.5–2.5)

## 2014-01-05 LAB — MRSA PCR SCREENING: MRSA by PCR: NEGATIVE

## 2014-01-05 LAB — CBG MONITORING, ED: Glucose-Capillary: 88 mg/dL (ref 70–99)

## 2014-01-05 LAB — TYPE AND SCREEN
ABO/RH(D): A POS
Antibody Screen: NEGATIVE

## 2014-01-05 LAB — PROTIME-INR
INR: 2.3 — AB (ref 0.00–1.49)
Prothrombin Time: 25.5 seconds — ABNORMAL HIGH (ref 11.6–15.2)

## 2014-01-05 LAB — APTT: aPTT: 55 seconds — ABNORMAL HIGH (ref 24–37)

## 2014-01-05 LAB — AMMONIA: Ammonia: 85 umol/L — ABNORMAL HIGH (ref 11–60)

## 2014-01-05 MED ORDER — HEPARIN SODIUM (PORCINE) 5000 UNIT/ML IJ SOLN
5000.0000 [IU] | Freq: Three times a day (TID) | INTRAMUSCULAR | Status: DC
  Administered 2014-01-05: 5000 [IU] via SUBCUTANEOUS
  Filled 2014-01-05 (×4): qty 1

## 2014-01-05 MED ORDER — SODIUM CHLORIDE 0.9 % IJ SOLN
10.0000 mL | Freq: Two times a day (BID) | INTRAMUSCULAR | Status: DC
  Administered 2014-01-05: 10 mL
  Administered 2014-01-05: 20 mL

## 2014-01-05 MED ORDER — NOREPINEPHRINE BITARTRATE 1 MG/ML IV SOLN
0.0000 ug/min | INTRAVENOUS | Status: DC
  Administered 2014-01-05: 2 ug/min via INTRAVENOUS
  Filled 2014-01-05: qty 16

## 2014-01-05 MED ORDER — PIPERACILLIN-TAZOBACTAM 3.375 G IVPB
3.3750 g | Freq: Three times a day (TID) | INTRAVENOUS | Status: DC
  Administered 2014-01-05: 3.375 g via INTRAVENOUS
  Filled 2014-01-05 (×3): qty 50

## 2014-01-05 MED ORDER — PIPERACILLIN-TAZOBACTAM IN DEX 2-0.25 GM/50ML IV SOLN
2.2500 g | Freq: Three times a day (TID) | INTRAVENOUS | Status: DC
  Administered 2014-01-05 – 2014-01-06 (×3): 2.25 g via INTRAVENOUS
  Filled 2014-01-05 (×5): qty 50

## 2014-01-05 MED ORDER — DEXTROSE 5 % IV SOLN
INTRAVENOUS | Status: DC
  Administered 2014-01-05: 02:00:00 via INTRAVENOUS
  Filled 2014-01-05 (×5): qty 150

## 2014-01-05 MED ORDER — WHITE PETROLATUM GEL
Status: AC
  Administered 2014-01-05: 0.2
  Filled 2014-01-05: qty 5

## 2014-01-05 MED ORDER — LACTULOSE 10 GM/15ML PO SOLN
10.0000 g | Freq: Two times a day (BID) | ORAL | Status: DC
  Administered 2014-01-05 (×2): 10 g via ORAL
  Filled 2014-01-05 (×3): qty 15

## 2014-01-05 MED ORDER — ONDANSETRON HCL 4 MG/2ML IJ SOLN: 4.0000 mg | Freq: Four times a day (QID) | INTRAMUSCULAR | Status: DC | PRN

## 2014-01-05 MED ORDER — ALBUMIN HUMAN 5 % IV SOLN
25.0000 g | Freq: Four times a day (QID) | INTRAVENOUS | Status: AC
  Administered 2014-01-05 (×4): 25 g via INTRAVENOUS
  Filled 2014-01-05 (×7): qty 500

## 2014-01-05 MED ORDER — ACETAMINOPHEN 325 MG PO TABS: 650.0000 mg | ORAL_TABLET | Freq: Four times a day (QID) | ORAL | Status: DC | PRN

## 2014-01-05 MED ORDER — SEVELAMER CARBONATE 800 MG PO TABS: 800.0000 mg | ORAL_TABLET | Freq: Three times a day (TID) | ORAL | Status: DC

## 2014-01-05 MED ORDER — VITAMIN B-1 100 MG PO TABS
100.0000 mg | ORAL_TABLET | Freq: Every day | ORAL | Status: DC
  Administered 2014-01-05 – 2014-01-07 (×2): 100 mg via ORAL
  Filled 2014-01-05 (×5): qty 1

## 2014-01-05 MED ORDER — ALBUTEROL SULFATE (2.5 MG/3ML) 0.083% IN NEBU: 2.5000 mg | INHALATION_SOLUTION | RESPIRATORY_TRACT | Status: DC | PRN

## 2014-01-05 MED ORDER — FOLIC ACID 1 MG PO TABS
1.0000 mg | ORAL_TABLET | Freq: Every day | ORAL | Status: DC
  Administered 2014-01-05 – 2014-01-08 (×3): 1 mg via ORAL
  Filled 2014-01-05 (×5): qty 1

## 2014-01-05 MED ORDER — SODIUM CHLORIDE 0.9 % IJ SOLN
10.0000 mL | INTRAMUSCULAR | Status: DC | PRN
  Administered 2014-01-05: 10 mL
  Filled 2014-01-05: qty 40

## 2014-01-05 MED ORDER — SEVELAMER CARBONATE 800 MG PO TABS
800.0000 mg | ORAL_TABLET | Freq: Three times a day (TID) | ORAL | Status: DC
  Administered 2014-01-05 – 2014-01-09 (×6): 800 mg via ORAL
  Filled 2014-01-05 (×15): qty 1

## 2014-01-05 MED ORDER — DEXTROSE 5 % IV SOLN
2.0000 ug/min | INTRAVENOUS | Status: DC
  Administered 2014-01-05: 5 ug/min via INTRAVENOUS
  Filled 2014-01-05: qty 4

## 2014-01-05 MED ORDER — FAMOTIDINE IN NACL 20-0.9 MG/50ML-% IV SOLN
20.0000 mg | Freq: Two times a day (BID) | INTRAVENOUS | Status: DC
  Administered 2014-01-05 (×3): 20 mg via INTRAVENOUS
  Filled 2014-01-05 (×6): qty 50

## 2014-01-05 MED ORDER — MIDODRINE HCL 5 MG PO TABS
10.0000 mg | ORAL_TABLET | Freq: Three times a day (TID) | ORAL | Status: DC
  Administered 2014-01-05 – 2014-01-09 (×13): 10 mg via ORAL
  Filled 2014-01-05 (×19): qty 2

## 2014-01-05 MED ORDER — SODIUM CHLORIDE 0.9 % IV SOLN
250.0000 mL | INTRAVENOUS | Status: DC | PRN
  Administered 2014-01-05: 250 mL via INTRAVENOUS

## 2014-01-05 MED ORDER — LACTULOSE 10 GM/15ML PO SOLN
10.0000 g | Freq: Every day | ORAL | Status: DC
  Administered 2014-01-07 – 2014-01-08 (×2): 10 g via ORAL
  Filled 2014-01-05 (×4): qty 15

## 2014-01-05 MED ORDER — LACTULOSE 10 GM/15ML PO SOLN
10.0000 g | Freq: Once | ORAL | Status: AC
  Administered 2014-01-05: 10 g via ORAL
  Filled 2014-01-05: qty 15

## 2014-01-05 MED ORDER — ADULT MULTIVITAMIN LIQUID CH
5.0000 mL | Freq: Every day | ORAL | Status: DC
  Administered 2014-01-05 – 2014-01-08 (×3): 5 mL via ORAL
  Filled 2014-01-05 (×5): qty 5

## 2014-01-05 NOTE — Progress Notes (Signed)
Chaplain came to bedside before going home for the night. Patient asleep at the time. Chaplain will relay message to next shift that patient has requested a chaplain to come and pray with her.   Will follow up with next shift and have someone come to bedside.   [name]

## 2014-01-05 NOTE — ED Notes (Signed)
CBG 88 

## 2014-01-05 NOTE — Progress Notes (Addendum)
ANTIBIOTIC CONSULT NOTE - INITIAL  Pharmacy Consult for Zosyn Indication: rule out sepsis  Allergies  Allergen Reactions  . Percocet [Oxycodone-Acetaminophen] Nausea Only  . Vicodin [Hydrocodone-Acetaminophen] Nausea Only    Patient Measurements: Height: 5\' 5"  (165.1 cm) Weight: 112 lb 10.5 oz (51.1 kg) IBW/kg (Calculated) : 57  Vital Signs: Temp: 97.5 F (36.4 C) (11/23 1211) Temp Source: Oral (11/23 1211) BP: 84/53 mmHg (11/23 0915) Pulse Rate: 84 (11/23 0915) Intake/Output from previous day: 11/22 0701 - 11/23 0700 In: 915.5 [I.V.:403; IV Piggyback:512.5] Out: 57 [Urine:50; Emesis/NG output:5; Stool:2] Intake/Output from this shift: Total I/O In: 695 [I.V.:170; IV Piggyback:525] Out: -   Labs:  Recent Labs  01/04/14 2300 01/04/14 2324 01/04/14 2336 01/05/14 0432  WBC 15.3*  --   --  14.4*  HGB 11.1*  --  10.9* 8.6*  PLT 108*  --   --  59*  CREATININE  --  5.29* 6.00* 5.17*   Estimated Creatinine Clearance: 8.4 mL/min (by C-G formula based on Cr of 5.17). No results for input(s): VANCOTROUGH, VANCOPEAK, VANCORANDOM, GENTTROUGH, GENTPEAK, GENTRANDOM, TOBRATROUGH, TOBRAPEAK, TOBRARND, AMIKACINPEAK, AMIKACINTROU, AMIKACIN in the last 72 hours.   Microbiology: Recent Results (from the past 720 hour(s))  Clostridium Difficile by PCR     Status: None   Collection Time: 12/28/13 12:50 PM  Result Value Ref Range Status   C difficile by pcr NEGATIVE NEGATIVE Final  Urine culture     Status: None   Collection Time: 12/28/13  5:01 PM  Result Value Ref Range Status   Specimen Description URINE, CATHETERIZED  Final   Special Requests NONE  Final   Culture  Setup Time   Final    12/29/2013 01:11 Performed at [place] Performed at [facility]   Final   Culture NO GROWTH Performed at [facility]   Final   Report Status 12/30/2013 FINAL  Final  MRSA PCR Screening     Status: None   Collection Time: 01/05/14   3:16 AM  Result Value Ref Range Status   MRSA by PCR NEGATIVE NEGATIVE Final    Comment:        The GeneXpert MRSA Assay (FDA approved for NASAL specimens only), is one component of a comprehensive MRSA colonization surveillance program. It is not intended to diagnose MRSA infection nor to guide or monitor treatment for MRSA infections.     Medical History: Past Medical History  Diagnosis Date  . Cirrhosis 12/2013  . Renal disorder   . COPD (chronic obstructive pulmonary disease)     Medications:  Scheduled:  . albumin human  25 g Intravenous Q6H  . famotidine (PEPCID) IV  20 mg Intravenous Q12H  . folic acid  1 mg Oral Daily  . lactulose  10 g Oral BID  . midodrine  10 mg Oral TID WC  . multivitamin  5 mL Oral Daily  . piperacillin-tazobactam (ZOSYN)  IV  2.25 g Intravenous 3 times per day  . sevelamer carbonate  800 mg Oral TID WC  . sodium chloride  10-40 mL Intracatheter Q12H  . thiamine  100 mg Oral Daily  . white petrolatum       Assessment: 55 yoF presents after recent discharge(11/19) with generalized weakness.  She is not making urine and is found to be in acute on chronic renal failure.  Pharmacy consulted to dose Zosyn for rule-out sepsis.  CrCl estimated to be ~ 9. No urine output noted  for today. Per Renal on 11/23, patient is not a candidate for RRT - recommended "Conservative Care".  Goal of Therapy:  Resolution of infection and clinical improvement  Plan:  1) Decrease Zosyn to 2.25 g IV q8h (non-extended infusion) in setting for Renal Failure 2) Monitor renal function, blood cultures, clinical picture and adjust antibiotics as needed  Theron Arista, PharmD Clinical Pharmacist - Resident Pager: [phone] 11/23/20151:18 PM

## 2014-01-05 NOTE — Progress Notes (Addendum)
I was able to speak with [name] alone today briefly and explained that she has liver and kidney failure with very limited options at this point. She does express to me that she does not want CPR/shock/intubation and I did pass this information to her daughters [name] and [name] in hallway. They were understanding and I believe agree with this decision. [name] tells me that the children had already discussed this with only 1 brother wanting resuscitation so they are happy to have her make the decision. [name] also asked me about a time frame and I admitted that it could be very limited to days for her to live. [name] also tells me that she is most worried about the kids and when I asked how we can help she tells me to get them together to talk about what is going on. [name] and [name] to help coordinate and let me know a time to meet them tomorrow.   [name], NP Palliative Medicine Team Pager # [phone] (M-F 8a-5p) Team Phone # [phone] (Nights/Weekends)  Daughter [place] [phone]

## 2014-01-05 NOTE — Progress Notes (Signed)
INITIAL NUTRITION ASSESSMENT  DOCUMENTATION CODES Per approved criteria  -Severe malnutrition in the context of chronic illness   Pt meets criteria for severe MALNUTRITION in the context of chronic illness as evidenced by severe depletion of muscle and subcutaneous fat mass.  INTERVENTION: Ensure Complete PO TID, each supplement provides 350 kcal and 13 grams of protein Recommend liberalize diet to regular.  NUTRITION DIAGNOSIS: Malnutriton related to poor oral intake as evidenced by severe depletion of muscle and subcutaneous fat mass.   Goal: Intake to meet >90% of estimated nutrition needs.  Monitor:  PO intake, labs, weight trend.  Reason for Assessment: MST  68 y.o. female  Admitting Dx: Leg swelling and weakness  ASSESSMENT: 68 y.o. female who presented to the ED on 11/22 complaining of generalized weakness. 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 Patient had been having very poor PO intake at home.  Patient reports very poor intake recently and drinks Ensure at home. Previous workup appears suspicious for hepatorenal syndrome.  Nutrition Focused Physical Exam:  Subcutaneous Fat:  Orbital Region: mild depletion Upper Arm Region: severe depletion Thoracic and Lumbar Region: NA  Muscle:  Temple Region: moderate depletion Clavicle Bone Region: severe depletion Clavicle and Acromion Bone Region: severe depletion Scapular Bone Region: NA Dorsal Hand: moderate depletion Patellar Region: mild depletion Anterior Thigh Region: moderate depletion Posterior Calf Region: mild depletion   Height: Ht Readings from Last 1 Encounters:  01/05/14 5\' 5"  (1.651 m)    Weight: Wt Readings from Last 1 Encounters:  01/05/14 112 lb 10.5 oz (51.1 kg)    Ideal Body Weight: 56.8 kg  % Ideal Body Weight: 90%  Wt Readings from Last 10 Encounters:  01/05/14 112 lb 10.5 oz (51.1 kg)  01/01/14 110 lb 3.2 oz (49.986 kg)   05/12/11 100 lb (45.36 kg)    Usual Body Weight: 110 lb  % Usual Body Weight: 102%  BMI:  Body mass index is 18.75 kg/(m^2).  Estimated Nutritional Needs: Kcal: 1550-1750 Protein: 75-95 gm Fluid: 1.8 L  Skin: closed neck incision  Diet Order: Diet renal W/1250mL fluid restriction  EDUCATION NEEDS: -Education not appropriate at this time   Intake/Output Summary (Last 24 hours) at 01/05/14 1422 Last data filed at 01/05/14 0900  Gross per 24 hour  Intake 1610.5 ml  Output     57 ml  Net 1553.5 ml    Last BM: 11/23   Labs:   Recent Labs Lab 12/31/13 0046 01/01/14 0536 01/04/14 2324 01/04/14 2336 01/05/14 0432  NA 134* 140 135* 133* 142  K 4.1 3.7 5.5* 5.1 4.7  CL 102 98 92* 98 96  CO2 17* 25 15*  --  22  BUN 51* 48* 78* 69* 78*  CREATININE 3.08* 2.90* 5.29* 6.00* 5.17*  CALCIUM 8.2* 8.4 9.4  --  9.4  MG  --   --   --   --  2.6*  PHOS 4.6 4.1  --   --  7.1*  GLUCOSE 145* 103* 162* 161* 119*    CBG (last 3)   Recent Labs  01/05/14 0017 01/05/14 0257 01/05/14 0822  GLUCAP 88 124* 117*    Scheduled Meds: . albumin human  25 g Intravenous Q6H  . famotidine (PEPCID) IV  20 mg Intravenous Q12H  . folic acid  1 mg Oral Daily  . lactulose  10 g Oral BID  . midodrine  10 mg Oral TID WC  . multivitamin  5 mL Oral Daily  .  piperacillin-tazobactam (ZOSYN)  IV  2.25 g Intravenous 3 times per day  . sevelamer carbonate  800 mg Oral TID WC  . sodium chloride  10-40 mL Intracatheter Q12H  . thiamine  100 mg Oral Daily  . white petrolatum        Continuous Infusions: . norepinephrine (LEVOPHED) Adult infusion 5 mcg/min (01/05/14 1149)  .  sodium bicarbonate  infusion 1000 mL 75 mL/hr at 01/05/14 0700    Past Medical History  Diagnosis Date  . Cirrhosis 12/2013  . Renal disorder   . COPD (chronic obstructive pulmonary disease)     Past Surgical History  Procedure Laterality Date  . Cholecystectomy    . Orif ankle fracture bimalleolar Left 2003     Molli Barrows, RD, LDN, [place] Pager [phone] After Hours Pager [phone]

## 2014-01-05 NOTE — Plan of Care (Signed)
Problem: Consults Goal: General Medical Patient Education See Patient Education Module for specific education. Outcome: Progressing Goal: Skin Care Protocol Initiated - if Braden Score 18 or less If consults are not indicated, leave blank or document N/A Outcome: Progressing Goal: Nutrition Consult-if indicated Outcome: Progressing Goal: Diabetes Guidelines if Diabetic/Glucose > 140 If diabetic or lab glucose is > 140 mg/dl - Initiate Diabetes/Hyperglycemia Guidelines & Document Interventions  Outcome: Progressing  Problem: Phase I Progression Outcomes Goal: Pain controlled with appropriate interventions Outcome: Progressing Goal: OOB as tolerated unless otherwise ordered Outcome: Progressing

## 2014-01-05 NOTE — Consult Note (Signed)
Renal Service Consult Note [facility]  [name] 01/05/2014 [place] Requesting Physician:  Dr Titus Mould  Reason for Consult:  Cirrhosis patient with severe renal failure HPI: The patient is a 68 y.o. year-old with hx of GIB , esophagitis, possible etoh abuse, admitted recently for abd pain and found to have large, micronodular liver with ascites and thrombocytopenia.  GI work up with serologies was negative and a liver biopsy was done.  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 US showed atrophic kidneys 7.4 and 7.8 cm.  Urine Na was low.   She returned to ED tonight with gen weakness, abd distension, intermittent diarrhea.  Family says patient not eating, "not herself" and "very swollen".  Not urinating as normal, very little urine.  Possibly SOB.    Since d/c the liver biopsy results are back showing "adenocarcinoma".    Patient has no complaints other than as above.   Chart review: 5/05 - anemia, black stools , Hb 5.9 > EGD showed diffuse bleeding at GE junction, no discrete cause, likely MW tear.  Rx with transfusion, PPI, Carafate, creat 0.8.  DC'd home 12/05 - syncopal episode , hematemesis, dysphagia > EGD showed severe erosive esophagitis and duodenitis, bx neg for malignancy.Rx with PPI, Carafate, transfusion prbc's. Hx Etoh abuse, rx with thiamine, folate, counseling.  11/14 - 01/01/14 > abd pain, N/V, anorexia, upper abd swelling x 1 month, no hx liver disease > work-up showed acute/ chronic renal failure seen by nephrology, likely CKD3, fu Dr Posey Pronto, creat 2.90 at dc. Seen by GI, dx was cryptogenic cirrhosis. Serologic w/u was negative so a liver biopsy was done, results pending at DC.  CT showed small amt ascites, significant hepatic enlargement, micronodular liver contour due to chronic liver disease or cirrhosis, not enough ascites for paracentesis. Outpt GI fu.  Low plts,  likely due to cirrhosis, etoh. HIV neg.  Anemia , transfused.    ROS  n/a  Past Medical History  Past Medical History  Diagnosis Date  . Cirrhosis 12/2013  . Renal disorder   . COPD (chronic obstructive pulmonary disease)    Past Surgical History  Past Surgical History  Procedure Laterality Date  . Cholecystectomy    . Orif ankle fracture bimalleolar Left 2003   Family History History reviewed. No pertinent family history. Social History  reports that she has been smoking Cigarettes.  She has a 50 pack-year smoking history. She uses smokeless tobacco. She reports that she drinks alcohol. She reports that she does not use illicit drugs. Allergies  Allergies  Allergen Reactions  . Percocet [Oxycodone-Acetaminophen] Nausea Only  . Vicodin [Hydrocodone-Acetaminophen] Nausea Only   Home medications Prior to Admission medications   Medication Sig Start Date End Date Taking? Authorizing Provider  feeding supplement, ENSURE COMPLETE, (ENSURE COMPLETE) LIQD Take 237 mLs by mouth 2 (two) times daily between meals. 01/01/14  Yes Thurnell Lose, MD  folic acid (FOLVITE) 1 MG tablet Take 1 tablet (1 mg total) by mouth daily. 01/01/14  Yes Thurnell Lose, MD  lactulose (CHRONULAC) 10 GM/15ML solution Take 30 mLs (20 g total) by mouth 2 (two) times daily. 01/01/14  Yes Thurnell Lose, MD  pantoprazole (PROTONIX) 40 MG tablet Take 1 tablet (40 mg total) by mouth 2 (two) times daily. 01/01/14  Yes Thurnell Lose, MD  thiamine 100 MG tablet Take 1 tablet (100 mg total) by  mouth daily. 01/01/14  Yes Thurnell Lose, MD  ondansetron (ZOFRAN) 4 MG tablet Take 1 tablet (4 mg total) by mouth every 6 (six) hours as needed for nausea. Patient not taking: Reported on 01/04/2014 01/01/14   Thurnell Lose, MD   Liver Function Tests  Recent Labs Lab 12/30/13 0533 12/31/13 0046 01/01/14 0536 01/04/14 2324  AST 80*  --  57* 98*  ALT 24  --  21 23  ALKPHOS 636*  --  562* 400*  BILITOT  2.8*  --  3.3* 3.7*  PROT 4.8*  --  4.5* 4.4*  ALBUMIN 1.2* 1.2* 1.1*  1.1* 1.0*   No results for input(s): LIPASE, AMYLASE in the last 168 hours. CBC  Recent Labs Lab 12/30/13 0533 01/01/14 0536 01/04/14 2300 01/04/14 2336  WBC 15.5* 17.1* 15.3*  --   NEUTROABS 13.1*  --  13.0*  --   HGB 10.4* 10.9* 11.1* 10.9*  HCT 32.1* 33.0* 34.1* 32.0*  MCV 94.1 95.1 97.7  --   PLT 70* 44* 108*  --    Basic Metabolic Panel  Recent Labs Lab 12/29/13 0457 12/30/13 0533 12/31/13 0046 01/01/14 0536 01/04/14 2324 01/04/14 2336  NA 136* 139 134* 140 135* 133*  K 5.7* 4.0 4.1 3.7 5.5* 5.1  CL 100 105 102 98 92* 98  CO2 13* 14* 17* 25 15*  --   GLUCOSE 40* 127* 145* 103* 162* 161*  BUN 61* 55* 51* 48* 78* 69*  CREATININE 3.03* 3.23* 3.08* 2.90* 5.29* 6.00*  CALCIUM 8.8 8.1* 8.2* 8.4 9.4  --   PHOS  --   --  4.6 4.1  --   --     Filed Vitals:   01/05/14 0234 01/05/14 0300 01/05/14 0326 01/05/14 0400  BP:   83/49 99/52  Pulse:   90 91  Temp: 97.8 F (36.6 C) 97.7 F (36.5 C) 97.8 F (36.6 C)   TempSrc: Oral Oral Oral   Resp:    18  Height:  5\' 5"  (1.651 m) 5\' 5"  (1.651 m)   Weight:  51.1 kg (112 lb 10.5 oz) 51.1 kg (112 lb 10.5 oz)   SpO2:   97% 98%   Exam Cachectic, weak, awake, tired appearing No rash, cyanosis or gangrene Sclera anicteric, throat clear No jvd Chest faint basilar rales bilat, no ^WOB RRR no MRG Abd distended, +ascites, liver down 8 cm Diffuse doughy edema of all 4 extremities Neuro is gen weakness, nonfocal  UA > neg protein, 3-6 wbc, 0-2 rbc, rare epi and bact [place] > last admit 11/15 showed atrophic kidneys 7.4 / 7.8 cm, no hydro   Assessment: 1. Renal failure - acute on chronic vs progression. Atrophic kidneys suggesting ESRD.  No vol depletion, creat up to 6 from 2.90 one week ago.   2. Hepatomegaly / liver failure - +adenocarcinoma on biopsy done last admit 3. FTT / cachexia  4. Severe malnutrition 5. Thrombocytopenia 6. Volume overload /  early pulm edema / moderate anasarca   Plan- patient very debilitated and cachectic, likely from liver cancer.  She also very likely has end-stage renal failure, ischemic in nature, with 7 cm kidneys. Renal function likely will not get better. She is a poor candidate for RRT given her comorbidities.  Recommend conservative care.   Will sign off, please call as needed.   Kelly Splinter MD (pgr) [phone]    ([phone] 01/05/2014, 4:46 AM

## 2014-01-05 NOTE — Progress Notes (Signed)
  Echocardiogram 2D Echocardiogram has been performed.  Diamond Nickel 01/05/2014, 10:46 AM

## 2014-01-05 NOTE — Progress Notes (Signed)
UR Completed.  Abbye Lao Jane [phone] 01/05/2014  

## 2014-01-05 NOTE — Consult Note (Signed)
[place]  Telephone:(336) [place]                                MR#: [id number]  DOB: 13-Jan-1946                                      CSN#: [id number]  Referring MD:   IMTS   Patient Care Team: Pcp Not In System as PCP - General  Reason for Consult: Metastatic Liver Cancer   [name] is [name] 68 y.o. female admitted on 11/15 with one month history of severe, progressive abdominal pain and increased abdominal distention, anorexia, nausea and vomiting.  She had weight loss with decreased appetite. She denied pruritic rash. She has [name] history of alcohol habituation and tobacco. She denies any risk factors for HIV.She denies any history of diabetes or other types of cancer. Her status was complicated with hepatic encephalopathy, congestive heart failure and kidney failure.   [name] CT of the abdomen and pelvis on 12/27/13 without contrast revealed significant hepatomegaly with micronodular liver contour, cirrhosis and perihepatic ascites. Also seen, small pelvic ascites. This ascitic fluid was not sufficient for diagnosis. No tumor markers were obtained.  SHe had [name] transjugular hepatic biopsy on 11/17 Path results, case number [id number], Dr. Lyndon Code are consistent with Adenocarcinoma. Stains are positive for Cytokeratin 7 and Cytokeratin 20. They are negative for CDX2, TTF-1,and Hepar 1. 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   PMH:  Past Medical History  Diagnosis Date   Cirrhosis 12/2013   Acute on Chronic Kidney Disease Stage 3    COPD (chronic obstructive pulmonary disease)     History of Mallory Weiss Tear, 2005   History of Erosive esophagitis 2007    History of Duodenitis 2005   History of anemia  Surgeries:  Past Surgical History  Procedure Laterality Date   Cholecystectomy     Orif ankle fracture  bimalleolar Left 2003    s/p hysterectomy, remote  Allergies:  Allergies  Allergen Reactions   Percocet [Oxycodone-Acetaminophen] Nausea Only   Vicodin [Hydrocodone-Acetaminophen] Nausea Only    Medications:   Prior to Admission:  Prescriptions prior to admission  Medication Sig Dispense Refill Last Dose   feeding supplement, ENSURE COMPLETE, (ENSURE COMPLETE) LIQD Take 237 mLs by mouth 2 (two) times daily between meals. 90 Bottle 1 01/03/2014 at Unknown time   folic acid (FOLVITE) 1 MG tablet Take 1 tablet (1 mg total) by mouth daily. 30 tablet 0 01/04/2014 at Unknown time   lactulose (CHRONULAC) 10 GM/15ML solution Take 30 mLs (20 g total) by mouth 2 (two) times daily. 240 mL 0 01/04/2014 at Unknown time   pantoprazole (PROTONIX) 40 MG tablet Take 1 tablet (40 mg total) by mouth 2 (two) times daily. 60 tablet 1 01/04/2014 at Unknown time   thiamine 100 MG tablet Take 1 tablet (100 mg total) by mouth daily. 30 tablet 0 01/01/2014   ondansetron (ZOFRAN) 4 MG tablet Take 1 tablet (4 mg total) by mouth every 6 (six) hours as needed for nausea. (Patient not taking: Reported on 01/04/2014) 20 tablet 0     Scheduled Meds:  albumin human  25 g Intravenous Q6H   famotidine (PEPCID) IV  20 mg Intravenous M42A   folic acid  1 mg Oral Daily   lactulose  10 g Oral BID   midodrine  10 mg Oral TID WC   multivitamin  5 mL Oral Daily   piperacillin-tazobactam (ZOSYN)  IV  2.25 g Intravenous 3 times per day   sevelamer carbonate  800 mg Oral TID WC   sodium chloride  10-40 mL Intracatheter Q12H   thiamine  100 mg Oral Daily   white petrolatum       Continuous Infusions:  norepinephrine (LEVOPHED) Adult infusion 5 mcg/min (01/05/14 1149)    sodium bicarbonate  infusion 1000 mL 75 mL/hr at 01/05/14 0700   PRN Meds:.sodium chloride, acetaminophen, albuterol, ondansetron (ZOFRAN) IV, sodium chloride  ROS: Constitutional: Denies fevers, chills or abnormal night  sweats Eyes: Denies blurriness of vision, double vision or watery eyes.  Ears, nose, mouth, throat, and face: Denies mucositis or sore throat Respiratory: Denies cough, dyspnea or wheezes Cardiovascular: Denies palpitation, chest discomfort . she has significant  lower extremity swelling Gastrointestinal:  Positive nausea, heartburn, diarrhea, and abdominal pain with ascites. Diarrhea due to lactulose used for acute encephalopathy Skin: Denies abnormal skin rashes. pruritus Lymphatics: Denies new lymphadenopathy or easy bruising Neurological:Denies numbness, tingling or new weaknesses Behavioral/Psych: Mood is stable, no new changes  All other systems were reviewed with the patient and are negative.   Family History:   Mother and Father died with heart failure. 2 brothers and 1 sister. No history of cancer.  Social History:  reports that she has been smoking Cigarettes.  She has [name] 50 pack-year smoking history. She uses smokeless tobacco. She reports that she drinks alcohol. She reports that she does not use illicit drugs. Widow. 5 children.  Cannot obtain any additional information due to lethargic state  Physical Exam    ECOG PERFORMANCE STATUS: 4   Filed Vitals:   01/05/14 1211  BP:   Pulse:   Temp: 97.5 F (36.4 C)  Resp:    Filed Weights   01/04/14 2134 01/05/14 0300 01/05/14 0326  Weight: 110 lb (49.896 kg) 112 lb 10.5 oz (51.1 kg) 112 lb 10.5 oz (51.1 kg)    GENERAL:lethargic, no distress and ill appearing SKIN: skin color mildly jaundiced, texture, turgor are dry, no rashes or significant lesions. No spider veins seen EYES: normal, conjunctiva are pink and non-injected, sclera mildly icteric OROPHARYNX:no exudate, no erythema and lips, buccal mucosa, and tongue normal  NECK: supple, thyroid normal size, non-tender, without nodularity. TRight JVD noted LYMPH:  no palpable lymphadenopathy in the cervical, axillary or inguinal area LUNGS: clear to auscultation and  percussion with normal breathing effort HEART: regular rate & rhythm and no murmurs and 2-3+lower extremity edema ABDOMEN: abdomen distended, tender at the RUQ to mid abdomen and normal bowel sounds. Liver edge palpable greater than 10 cm bellow the costal border  Musculoskeletal:no cyanosis of digits and no clubbing  PSYCH: lethargic. answers correctly the place and year and month, cannot remember date. follows simple commands.  NEURO: no focal motor/sensory deficits. Able to follow very simple commands. Gait not tested Breasts: Bilateral breast without mass   Labs:  CBC   Recent Labs Lab 12/30/13 0533 01/01/14 0536 01/04/14 2300 01/04/14 2336 01/05/14 0432  WBC 15.5* 17.1* 15.3*  --  14.4*  HGB 10.4* 10.9* 11.1* 10.9* 8.6*  HCT 32.1* 33.0* 34.1* 32.0* 26.3*  PLT 70* 44* 108*  --  59*  MCV 94.1 95.1 97.7  --  95.6  MCH 30.5  31.4 31.8  --  31.3  MCHC 32.4 33.0 32.6  --  32.7  RDW 20.1* 20.2* 21.4*  --  20.4*  LYMPHSABS 0.8  --  0.6*  --   --   MONOABS 1.2*  --  1.7*  --   --   EOSABS 0.3  --  0.0  --   --   BASOSABS 0.1  --  0.0  --   --      CMP    Recent Labs Lab 12/30/13 0533 12/31/13 0046 01/01/14 0536 01/04/14 2324 01/04/14 2336 01/05/14 0432  NA 139 134* 140 135* 133* 142  K 4.0 4.1 3.7 5.5* 5.1 4.7  CL 105 102 98 92* 98 96  CO2 14* 17* 25 15*  --  22  GLUCOSE 127* 145* 103* 162* 161* 119*  BUN 55* 51* 48* 78* 69* 78*  CREATININE 3.23* 3.08* 2.90* 5.29* 6.00* 5.17*  CALCIUM 8.1* 8.2* 8.4 9.4  --  9.4  MG  --   --   --   --   --  2.6*  AST 80*  --  57* 98*  --  127*  ALT 24  --  21 23  --  30  ALKPHOS 636*  --  562* 400*  --  361*  BILITOT 2.8*  --  3.3* 3.7*  --  3.9*        Component Value Date/Time   BILITOT 3.9* 01/05/2014 0432   BILIDIR 3.1* 01/04/2014 2324   IBILI 0.6 01/04/2014 2324      Recent Labs Lab 01/05/14 0432  INR 2.30*    No results for input(s): DDIMER in the last 72 hours.   Anemia panel: Iron/TIBC/Ferritin/  %Sat    Component Value Date/Time   IRON 35* 12/28/2013 1015   TIBC 218* 12/28/2013 1015   FERRITIN 127 12/28/2013 1015   IRONPCTSAT 16* 12/28/2013 1015  B12 >2000 Folate >20  Imaging Studies:  Ct Abdomen Pelvis Wo Contrast  12/27/2013 COMPARISON:  07/28/2007.  FINDINGS: Lung bases are unremarkable. Sagittal images of the spine are unremarkable. There is small perihepatic ascites. There is hepatomegaly. The liver measures at least 22 cm in length. There is micro nodular liver contour highly suspicious for chronic liver disease or early cirrhosis.  The study is limited without IV contrast. The patient is status postcholecystectomy.  Atherosclerotic calcifications of abdominal aorta and iliac arteries. Unenhanced pancreas and spleen is unremarkable. The spleen measures 8.9 cm in length.  Adrenal glands are unremarkable. Unenhanced kidneys are symmetrical in size. There is [name] probable cyst in midpole of the right kidney measures 2 cm. No nephrolithiasis. No hydronephrosis or hydroureter. No calcified ureteral calculi are noted. Small ascites is noted in right paracolic gutter.  Small pelvic ascites. Oral contrast material was given to the patient. No small bowel obstruction. No colonic obstruction. Contrast material noted all the way in rectosigmoid colon.  There is small left inguinal hernia containing ascites measures about 1.2 cm. There is old fracture deformity of the left inferior pubic ramus.  The uterus is not identified.  No inguinal adenopathy.  IMPRESSION: 1. There is perihepatic ascites. Significant hepatomegaly. There is micro nodular liver contour. Chronic liver disease or cirrhosis cannot be excluded. Clinical correlation is necessary. 2. The patient is status post cholecystectomy.  No splenomegaly. 3. There is [name] cyst in midpole of the right kidney measures 2 cm. No nephrolithiasis. No hydronephrosis or hydroureter. 4. Atherosclerotic calcifications of abdominal aorta and iliac arteries. 5.  Small pelvic  ascites. The uterus is not identified. No small bowel or colonic obstruction.   Electronically Signed   By: Lahoma Crocker M.D.   On: 12/27/2013 18:31   Dg Chest 2 View  12/27/2013   CLINICAL DATA:  Lower abdominal pain and lower extremity swelling  EXAM: CHEST  2 VIEW  COMPARISON:  11/02/2007  FINDINGS: Cardiac shadow is within normal limits. The lungs are hyperaerated bilaterally. No focal infiltrate or sizable effusion is seen.  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   Electronically Signed   By: Inez Catalina M.D.   On: 12/27/2013 16:16   US Abdomen Complete  12/28/2013   CLINICAL DATA:  Elevated liver function tests. Acute renal insufficiency. Ethanol abuse.  EXAM: ULTRASOUND ABDOMEN COMPLETE  COMPARISON:  12/27/2013  FINDINGS: Gallbladder: Surgically absent  Common bile duct: Diameter: Normal, 5 mm.  Liver: Moderate cirrhosis. Heterogeneous echogenicity is likely related to cirrhosis and possibly heterogeneous steatosis.  IVC: No abnormality visualized.  Pancreas: Visualized portion unremarkable.  Spleen: Size and appearance within normal limits.  Right Kidney: Length: 7.4 cm. Atrophic with increased echogenicity. No hydronephrosis. 1.9 cm interpolar cyst.  Left Kidney: Length: 7.8 cm. Atrophic and hyperechoic. 1.4 cm lower pole cyst.  Abdominal aorta: Atherosclerosis throughout the abdominal aorta.  Other findings: Small volume perihepatic ascites.  IMPRESSION: 1. Cirrhosis and small volume ascites. 2. Atrophic kidneys, most consistent with medical renal disease. 3.  Cholecystectomy, without biliary ductal dilatation.   Electronically Signed   By: Abigail Miyamoto M.D.   On: 12/28/2013 16:12   Ir Venogram Hepatic W Hemodynamic Evaluation  12/31/2013   INDICATION: New diagnosis of cirrhosis. Now with ascites. Please perform transjugular liver biopsy for tissue diagnostic purposes.  EXAM: FLUOROSCOPIC GUIDED TRANSJUGULAR LIVER BIOPSY  ULTRASOUND GUIDANCE FOR VENOUS ACCESS  COMPARISON:  CT  abdomen pelvis -12/27/2013  MEDICATIONS: Fentanyl 50 mcg IV; Versed 1 mg IV  CONTRAST:  None.  CO2 was utilized given renal insufficiency.  ANESTHESIA/SEDATION: Total Moderate Sedation Time  30 minutes  FLUOROSCOPY TIME:  2 minutes.  36 seconds.  COMPLICATIONS: None immediate  TECHNIQUE: Informed written consent was obtained from the patient after [name] discussion of the risks, benefits and alternatives to treatment. Questions regarding the procedure were encouraged and answered. [name] timeout was performed prior to the initiation of the procedure.  The right neck was prepped and draped in the usual sterile fashion, and [name] sterile drape was applied covering the operative field. Maximum barrier sterile technique with sterile gowns and gloves were used for the procedure. [name] timeout was performed prior to the initiation of the procedure. Local anesthesia was provided with 1% lidocaine with epinephrine.  The right internal jugular vein was accessed with [name] micropuncture kit under direct ultrasound guidance. An ultrasound image was saved for documentation purposes. The micropuncture sheath was exchanged for [name] 9-French vascular sheath over [name] Benson wire. [name] 100 cm Bernstein catheter was advanced over [name] stiff Glidewire to select the middle hepatic vein. [name] CO2 hepatic venogram was performed. Free hepatic and right atrial pressures were obtained with the Berenstein catheter. The Berenstein catheter was exchanged for [name] Dextera transjugular biopsy sheath. 3 core needle biopsies were obtained and anterior to the middle hepatic vein under direct fluoroscopic guidance. Samples were deemed adequate, placed in formalin and submitted to pathology. At this point the procedure was terminated. All wires, catheters and sheaths removed and the patient. Hemostasis was achieved at the right neck access site with manual compression. [name] dressing was placed. The patient tolerated the  procedure well without immediate postprocedural complication.  FINDINGS:  Normal hepatic venogram.  Pressure measurements as follows:  Free hepatic  12/5 (mean - 8)  Wedged hepatic  24/7 (mean - 17)  Gradient  9  Successful acquisition of 3 core needle biopsies anterior to the middle hepatic vein.  IMPRESSION: 1. No portosystemic gradient to suggest portal venous hypertension. 2. Successful fluoroscopic guided transjugular liver biopsy. 3. Unremarkable hepatic CO2 venogram.   Electronically Signed   By: Sandi Mariscal M.D.   On: 12/31/2013 11:44   Ir Transcatheter Bx  12/31/2013   INDICATION: New diagnosis of cirrhosis. Now with ascites. Please perform transjugular liver biopsy for tissue diagnostic purposes.  EXAM: FLUOROSCOPIC GUIDED TRANSJUGULAR LIVER BIOPSY  ULTRASOUND GUIDANCE FOR VENOUS ACCESS  COMPARISON:  CT abdomen pelvis -12/27/2013  MEDICATIONS: Fentanyl 50 mcg IV; Versed 1 mg IV  CONTRAST:  None.  CO2 was utilized given renal insufficiency.  ANESTHESIA/SEDATION: Total Moderate Sedation Time  30 minutes  FLUOROSCOPY TIME:  2 minutes.  36 seconds.  COMPLICATIONS: None immediate  TECHNIQUE: Informed written consent was obtained from the patient after [name] discussion of the risks, benefits and alternatives to treatment. Questions regarding the procedure were encouraged and answered. [name] timeout was performed prior to the initiation of the procedure.  The right neck was prepped and draped in the usual sterile fashion, and [name] sterile drape was applied covering the operative field. Maximum barrier sterile technique with sterile gowns and gloves were used for the procedure. [name] timeout was performed prior to the initiation of the procedure. Local anesthesia was provided with 1% lidocaine with epinephrine.  The right internal jugular vein was accessed with [name] micropuncture kit under direct ultrasound guidance. An ultrasound image was saved for documentation purposes. The micropuncture sheath was exchanged for [name] 9-French vascular sheath over [name] Benson wire. [name] 100 cm Bernstein catheter was  advanced over [name] stiff Glidewire to select the middle hepatic vein. [name] CO2 hepatic venogram was performed. Free hepatic and right atrial pressures were obtained with the Berenstein catheter. The Berenstein catheter was exchanged for [name] Dextera transjugular biopsy sheath. 3 core needle biopsies were obtained and anterior to the middle hepatic vein under direct fluoroscopic guidance. Samples were deemed adequate, placed in formalin and submitted to pathology. At this point the procedure was terminated. All wires, catheters and sheaths removed and the patient. Hemostasis was achieved at the right neck access site with manual compression. [name] dressing was placed. The patient tolerated the procedure well without immediate postprocedural complication.  FINDINGS: Normal hepatic venogram.  Pressure measurements as follows:  Free hepatic  12/5 (mean - 8)  Wedged hepatic  24/7 (mean - 17)  Gradient  9  Successful acquisition of 3 core needle biopsies anterior to the middle hepatic vein.  IMPRESSION: 1. No portosystemic gradient to suggest portal venous hypertension. 2. Successful fluoroscopic guided transjugular liver biopsy. 3. Unremarkable hepatic CO2 venogram.   Electronically Signed   By: Sandi Mariscal M.D.   On: 12/31/2013 11:44   Ir US Guide Vasc Access Right  12/31/2013   INDICATION: New diagnosis of cirrhosis. Now with ascites. Please perform transjugular liver biopsy for tissue diagnostic purposes.  EXAM: FLUOROSCOPIC GUIDED TRANSJUGULAR LIVER BIOPSY  ULTRASOUND GUIDANCE FOR VENOUS ACCESS  COMPARISON:  CT abdomen pelvis -12/27/2013  MEDICATIONS: Fentanyl 50 mcg IV; Versed 1 mg IV  CONTRAST:  None.  CO2 was utilized given renal insufficiency.  ANESTHESIA/SEDATION: Total Moderate Sedation Time  30 minutes  FLUOROSCOPY TIME:  2 minutes.  36 seconds.  COMPLICATIONS: None immediate  TECHNIQUE: Informed written consent was obtained from the patient after [name] discussion of the risks, benefits and alternatives to treatment.  Questions regarding the procedure were encouraged and answered. [name] timeout was performed prior to the initiation of the procedure.  The right neck was prepped and draped in the usual sterile fashion, and [name] sterile drape was applied covering the operative field. Maximum barrier sterile technique with sterile gowns and gloves were used for the procedure. [name] timeout was performed prior to the initiation of the procedure. Local anesthesia was provided with 1% lidocaine with epinephrine.  The right internal jugular vein was accessed with [name] micropuncture kit under direct ultrasound guidance. An ultrasound image was saved for documentation purposes. The micropuncture sheath was exchanged for [name] 9-French vascular sheath over [name] Benson wire. [name] 100 cm Bernstein catheter was advanced over [name] stiff Glidewire to select the middle hepatic vein. [name] CO2 hepatic venogram was performed. Free hepatic and right atrial pressures were obtained with the Berenstein catheter. The Berenstein catheter was exchanged for [name] Dextera transjugular biopsy sheath. 3 core needle biopsies were obtained and anterior to the middle hepatic vein under direct fluoroscopic guidance. Samples were deemed adequate, placed in formalin and submitted to pathology. At this point the procedure was terminated. All wires, catheters and sheaths removed and the patient. Hemostasis was achieved at the right neck access site with manual compression. [name] dressing was placed. The patient tolerated the procedure well without immediate postprocedural complication.  FINDINGS: Normal hepatic venogram.  Pressure measurements as follows:  Free hepatic  12/5 (mean - 8)  Wedged hepatic  24/7 (mean - 17)  Gradient  9  Successful acquisition of 3 core needle biopsies anterior to the middle hepatic vein.  IMPRESSION: 1. No portosystemic gradient to suggest portal venous hypertension. 2. Successful fluoroscopic guided transjugular liver biopsy. 3. Unremarkable hepatic CO2 venogram.    Electronically Signed   By: Sandi Mariscal M.D.   On: 12/31/2013 11:44   Dg Abd Acute W/chest  01/05/2014   CLINICAL DATA:  Abdominal pain.  History of renal disorder  EXAM: ACUTE ABDOMEN SERIES (ABDOMEN 2 VIEW & CHEST 1 VIEW)  COMPARISON:  None.  FINDINGS: No abnormal bowel dilatation identified. Cholecystectomy clips noted in the right upper quadrant. The heart size appears normal. There are small bilateral pleural effusions and mild interstitial edema.  IMPRESSION: 1. Congestive heart failure. 2. Nonobstructive bowel gas pattern.   Electronically Signed   By: Kerby Moors M.D.   On: 01/05/2014 00:12   FINAL for [name], [name] ([id number]) Patient: [name], [name] Collected: 12/30/2013 Client: [facility] Accession: [id number] Received: 12/30/2013 Delfin Edis DOB: October 27, 1945 Age: 82 Gender: F Reported: 01/02/2014 [address] Patient Ph: [phone] MRN #: [id number] [place], [place] [address] Visit #: [id number] Chart #: Phone:  Fax: CC: David Tat, DO REPORT OF SURGICAL PATHOLOGY FINAL DIAGNOSIS Diagnosis Liver, needle/core biopsy - ADENOCARCINOMA. - SEE COMMENT. Microscopic Comment The malignant cells are positive for Cytokeratin 7 and Cytokeratin 20. They are negative for CDX2, TTF-1,and Hepar 1. Overall, the histologic features and this immunohistochemical profile are most consistent with primary pancreatobiliary tumor. Dr. Olevia Perches was paged on 01/02/2014. Enid Cutter MD Pathologist, Electronic Signature (Case signed 01/02/2014)    [name]/P: 68 y.o. female   Adenocarcinoma Patient was admitted for further workup of cirrhosis and hepatomegaly She underwent hepatojugular biopsy on 11/17 by IR. Preliminary diagnosis is consistent with Adenocarcinoma, likely [place] versus cholangiocarcinoma Agree with Hospice  evaluation, to concentrate on her quality of life, comfort care.   Malnutrition  Secondary to #1      Hyperbilirubinemia     Acute on  chronic renal failure, Stage 3 As per Nephrology  Anemia   Thrombocytopenia-chronic  Leukocytosis   Dyspnea Due to pulmonary edema due to third spacing due to severely low albumin and congestive heart failure 2 D echo pending.    Hepatic Encephalopathy, mild On lactulose per admitting team.   Coagulopathy  Hypotension secondary to liver failure  Full Code  **Disclaimer: This note was dictated with voice recognition software. Similar sounding words can inadvertently be transcribed and this note may contain transcription errors which may not have been corrected upon publication of note.** Rondel Jumbo, PA-C 01/05/2014 3:19 PM  Ms. Bruster was interviewed and examined.  Impression 1. Adenocarcinoma involving [name] random liver biopsy 2. Cirrhosis 3. Anemia 4. Thrombocytopenia secondary to cirrhosis 5. Coagulopathy secondary to hepatic failure 6. Acute/chronic renal failure 7. History of alcohol use 8. Hepatic encephalopathy  She has been diagnosed with adenocarcinoma involving the liver. There is no clear source for [name] primary tumor outside of the liver. 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 Ms. Hirt has multiorgan failure in the setting of advanced carcinoma. She has [name] poor performance status.  She is not [name] candidate for systemic therapy. I agree with the plan for Hospice care.  Recommendations: 1. Address CODE STATUS with patient and family 2. Hospice referral for home care versus [facility] 3. I am available to discuss the situation with her family as needed.

## 2014-01-05 NOTE — Progress Notes (Signed)
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 Congestive heart failure. 2. Nonobstructive bowel gas pattern.  SIGNIFICANT EVENTS: 11/22 admitted  SUBJECTIVE:  RN reports low blood pressures this morning (systolic 78G). Pt reports no pain, no trouble breathing.   VITAL SIGNS: Temp:  [96.1 F (35.6 C)-98.7 F (37.1 C)] 97.5 F (36.4 C) (11/23 1211) Pulse Rate:  [81-104] 84 (11/23 0915) Resp:  [18-26] 19 (11/23 0915) BP: (73-131)/(47-65) 84/53 mmHg (11/23 0915) SpO2:  [92 %-98 %] 97 % (11/23 0915) Weight:  [49.896 kg (110 lb)-51.1 kg (112 lb 10.5 oz)] 51.1 kg (112 lb 10.5 oz) (11/23 0326) HEMODYNAMICS:   VENTILATOR SETTINGS:   INTAKE / OUTPUT:  Intake/Output Summary (Last 24 hours) at 01/05/14 1451 Last data filed at 01/05/14 0900  Gross per 24 hour  Intake 1610.5 ml  Output     57 ml  Net 1553.5 ml    PHYSICAL EXAMINATION: General:  NAD, appears weak Neuro:  Alert and oriented x 3. No focal abnormality noted. Follows commands HEENT:  NCAT, PERRL, EOMI Cardiovascular:  RRR, quiet s1/s2, no m/r/g appreciated Lungs:  Decreased breath sounds bilaterally, no wheeze Abdomen:  Soft, distended, no guarding, liver edge palpable 10cm below rib, hypoactive bowel sounds Musculoskeletal:  No deformities, 3+ edema both ankles. Feet WWP. Skin:  No ecchymosis, no rash  LABS:  CBC  Recent Labs Lab 01/01/14 0536 01/04/14 2300 01/04/14 2336 01/05/14 0432  WBC 17.1* 15.3*  --  14.4*  HGB 10.9* 11.1* 10.9* 8.6*  HCT 33.0* 34.1* 32.0* 26.3*  PLT 44* 108*  --  59*   Coag's  Recent Labs Lab 01/05/14 0432  APTT 55*  INR 2.30*   BMET  Recent Labs Lab 01/01/14 0536 01/04/14 2324 01/04/14 2336 01/05/14 0432  NA 140 135*  133* 142  K 3.7 5.5* 5.1 4.7  CL 98 92* 98 96  CO2 25 15*  --  22  BUN 48* 78* 69* 78*  CREATININE 2.90* 5.29* 6.00* 5.17*  GLUCOSE 103* 162* 161* 119*   Electrolytes  Recent Labs Lab 12/31/13 0046 01/01/14 0536 01/04/14 2324 01/05/14 0432  CALCIUM 8.2* 8.4 9.4 9.4  MG  --   --   --  2.6*  PHOS 4.6 4.1  --  7.1*   Sepsis Markers  Recent Labs Lab 01/04/14 2319 01/05/14 0432  LATICACIDVEN 11.07* 7.1*   ABG No results for input(s): PHART, PCO2ART, PO2ART in the last 168 hours. Liver Enzymes  Recent Labs Lab 01/01/14 0536 01/04/14 2324 01/05/14 0432  AST 57* 98* 127*  ALT 21 23 30   ALKPHOS 562* 400* 361*  BILITOT 3.3* 3.7* 3.9*  ALBUMIN 1.1*  1.1* 1.0* 1.5*   Cardiac Enzymes  Recent Labs Lab 12/31/13 0046 01/04/14 2300  TROPONINI <0.30  --   PROBNP  --  6795.0*   Glucose  Recent Labs Lab 12/30/13 1217 01/04/14 2305 01/04/14 2309 01/05/14 0017 01/05/14 0257 01/05/14 0822  GLUCAP 107* 15*  15* 11*  11* 88 124* 117*    Imaging No results found.   ASSESSMENT / PLAN:  PULMONARY A:  Dyspnea due to pulmonary edema due to third spacing from severely low albumin, unknown if there is any heart failure component P:   Minimize crystalloids, will give albumin for now although  this is temporary in nature Not a candidate for diuresis due to likely hepatorenal syndrome Oxygen therapy as needed If she continues to get worse may need intubation at some point  CARDIOVASCULAR A:  Hypotension likely related to decompensated liver failure and third spacing, unknown if there is heart failure component P:  See above (pulmonary plans) 2d echo  RENAL A:   AKI on CKD, 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  Thiamine, folate, MVI Check INR and aPTT  HEMATOLOGIC A:   Anemia of chronic disease Thrombocytopenia P:  Monitor, goal Hb 7  INFECTIOUS A:   Sepsis ruleout P:   Will give abx for now until ruled out, follow up culture BCx2  11/23 >>> UC  11/23 >>> Abx:  Zosyn 01/04/14 >>>  ENDOCRINE A:   No acute issues P:   Monitor  NEUROLOGIC A:   Mild hepatic encephalopathy P:   Continue lactulose goal 2 loose BM per day   FAMILY  - Updates: Daughter updated 11/23, currently full code.  TODAY'S SUMMARY: 68 y.o. female with likely decompensated liver failure.     Tawanna Sat, MD 01/05/2014, 2:51 PM PGY-2, [facility]  PCCM ATTENDING: Pt seen on work rounds with care provider noted above. We reviewed pt's initial presentation, consultants notes and hospital database in detail.  The above assessment and plan was formulated under my direction.  In summary: Pt has had protracted decline in health and function She has a newly diagnosed adenocarcinoma by liver biopsy performed 11/17 The primary site is unclear - most likely GI source Her admission diagnosis is hypotension due to presumed severe sepsis/septic shock of unclear etiology She has AKI on top of CKD She is deemed by Renal Consultant to not be a candidate for HD - a determination with which I concur She is very weak, cachectic, somewhat lethargic but in no overt distress Her chest is clear anteriorly and her abdominal exam is unimpressive For now, will have a PICC placed and continue to support with vasopressors Continue broad spectrum antibiotics as we continue to eval for presumed sepsis I have notified GI service of her presence since they were involved in her care last week We will get input from Oncology to address what therapeutic options might be available though I  doubt there is much to offer that will make much meaningful difference here Therefore, will also ask for assistance from Palliative Care Service to address goals of care In all likelihood, the best possible outcome her would be home with Hospice and focus on comfort/dignity   45 minutes of independent CCM time was provided by me   Merton Border, MD;  PCCM service; Mobile [phone]

## 2014-01-05 NOTE — Progress Notes (Deleted)
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 Congestive heart failure. 2. Nonobstructive bowel gas pattern.  SIGNIFICANT EVENTS: 11/22 admitted  SUBJECTIVE:  RN reports low blood pressures this morning (systolic 97Q). Pt reports no pain, no trouble breathing.   VITAL SIGNS: Temp:  [96.1 F (35.6 C)-98.7 F (37.1 C)] 97.8 F (36.6 C) (11/23 0326) Pulse Rate:  [82-104] 84 (11/23 0600) Resp:  [18-26] 18 (11/23 0600) BP: (80-131)/(48-65) 104/56 mmHg (11/23 0600) SpO2:  [92 %-98 %] 97 % (11/23 0600) Weight:  [49.896 kg (110 lb)-51.1 kg (112 lb 10.5 oz)] 51.1 kg (112 lb 10.5 oz) (11/23 0326) HEMODYNAMICS:   VENTILATOR SETTINGS:   INTAKE / OUTPUT:  Intake/Output Summary (Last 24 hours) at 01/05/14 0820 Last data filed at 01/05/14 0700  Gross per 24 hour  Intake    903 ml  Output     57 ml  Net    846 ml    PHYSICAL EXAMINATION: General:  NAD, appears weak Neuro:  Alert and oriented x 3. No focal abnormality noted. Follows commands HEENT:  NCAT, PERRL, EOMI Cardiovascular:  RRR, quiet s1/s2, no m/r/g appreciated Lungs:  Decreased breath sounds bilaterally, no wheeze Abdomen:  Soft, distended, no guarding, liver edge palpable 10cm below rib, hypoactive bowel sounds Musculoskeletal:  No deformities, 3+ edema both ankles. Feet WWP. Skin:  No ecchymosis, no rash  LABS:  CBC  Recent Labs Lab 01/01/14 0536 01/04/14 2300 01/04/14 2336 01/05/14 0432  WBC 17.1* 15.3*  --  14.4*  HGB 10.9* 11.1* 10.9* 8.6*  HCT 33.0* 34.1* 32.0* 26.3*  PLT 44* 108*  --  59*   Coag's  Recent Labs Lab 01/05/14 0432  APTT 55*  INR 2.30*   BMET  Recent Labs Lab 01/01/14 0536 01/04/14 2324 01/04/14 2336 01/05/14 0432  NA 140  135* 133* 142  K 3.7 5.5* 5.1 4.7  CL 98 92* 98 96  CO2 25 15*  --  22  BUN 48* 78* 69* 78*  CREATININE 2.90* 5.29* 6.00* 5.17*  GLUCOSE 103* 162* 161* 119*   Electrolytes  Recent Labs Lab 12/31/13 0046 01/01/14 0536 01/04/14 2324 01/05/14 0432  CALCIUM 8.2* 8.4 9.4 9.4  MG  --   --   --  2.6*  PHOS 4.6 4.1  --  7.1*   Sepsis Markers  Recent Labs Lab 01/04/14 2319 01/05/14 0432  LATICACIDVEN 11.07* 7.1*   ABG No results for input(s): PHART, PCO2ART, PO2ART in the last 168 hours. Liver Enzymes  Recent Labs Lab 01/01/14 0536 01/04/14 2324 01/05/14 0432  AST 57* 98* 127*  ALT 21 23 30   ALKPHOS 562* 400* 361*  BILITOT 3.3* 3.7* 3.9*  ALBUMIN 1.1*  1.1* 1.0* 1.5*   Cardiac Enzymes  Recent Labs Lab 12/31/13 0046 01/04/14 2300  TROPONINI <0.30  --   PROBNP  --  6795.0*   Glucose  Recent Labs Lab 12/30/13 1217 01/04/14 2305 01/04/14 2309 01/05/14 0017 01/05/14 0257  GLUCAP 107* 15* 11* 88 124*    Imaging No results found.   ASSESSMENT / PLAN:  PULMONARY A:  Dyspnea due to pulmonary edema due to third spacing from severely low albumin, unknown if there is any heart failure component P:   Minimize crystalloids, will give albumin for now although this  is temporary in nature Not a candidate for diuresis due to likely hepatorenal syndrome Oxygen therapy as needed If she continues to get worse may need intubation at some point  CARDIOVASCULAR A:  Hypotension likely related to decompensated liver failure and third spacing, unknown if there is heart failure component P:  See above (pulmonary plans) 2d echo  RENAL A:   AKI on CKD, 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 No diuretic for now Monitor electrolytes, shifting maneuver for hyperkalemia Renal diet as tolerated Likely not a candidate for dialysis in view of her overall picture  GASTROINTESTINAL A:   Decompensated liver failure from cirrhosis +  adenocarcinoma Mild hepatic encephalopathy P:   Continue lactulose goal 2 loose BM per day GI prophylaxis.  Thiamine, folate, MVI Check INR and aPTT  HEMATOLOGIC A:   Anemia of chronic disease Thrombocytopenia P:  Monitor, goal Hb 7  INFECTIOUS A:   Sepsis ruleout P:   Will give abx for now until ruled out, follow up culture BCx2  11/23 >>> UC  11/23 >>> Abx:  Zosyn 01/04/14 >>>  ENDOCRINE A:   No acute issues P:   Monitor  NEUROLOGIC A:   Mild hepatic encephalopathy P:   Continue lactulose goal 2 loose BM per day   FAMILY  - Updates: Daughter updated 11/23, currently full code.  TODAY'S SUMMARY: 68 y.o. female with likely decompensated liver failure.     Tawanna Sat, MD 01/05/2014, 8:25 AM PGY-2, [place]

## 2014-01-05 NOTE — ED Notes (Signed)
Pt's family member came out of room stating "There's something wrong with my mom. I need a nurse!". This RN went into the room, where the pt's daughter stated that the pt had been reporting nausea and had been sighing. This RN asked the pt if she still felt nauseous, and she stated "I do but I don't". Pt's vital signs WDL. NAD noted.

## 2014-01-05 NOTE — Progress Notes (Signed)
Family at bedside, tearful.  Family updated on pt status, questions answered, support given.  Will monitor.

## 2014-01-05 NOTE — Progress Notes (Signed)
Peripherally Inserted Central Catheter/Midline Placement  The IV Nurse has discussed with the patient and/or persons authorized to consent for the patient, the purpose of this procedure and the potential benefits and risks involved with this procedure.  The benefits include less needle sticks, lab draws from the catheter and patient may be discharged home with the catheter.  Risks include, but not limited to, infection, bleeding, blood clot (thrombus formation), and puncture of an artery; nerve damage and irregular heat beat.  Alternatives to this procedure were also discussed.  PICC/Midline Placement Documentation        [name] 01/05/2014, 12:41 PM Consent obtained by Claretha Cooper, RN

## 2014-01-05 NOTE — H&P (Addendum)
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 Congestive heart failure. 2. Nonobstructive bowel gas pattern.  SIGNIFICANT EVENTS:   HISTORY OF PRESENT ILLNESS:  This is a 68 y.o. female who presents to the Emergency Department complaining of generalized weakness. 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 Patient had been having very poor PO intake at home; she is "not herself," and "very swollen." Daughter notes that patient's leg and abdominal swelling worsened and the patient is having difficulty ambulating. Patient has very little urine today, if any. Her body temperature has been running low. Daughter reports that patient appears SOB to her. She denies vomiting, fevers or chills. She also now have acute on chronic renal failure in the setting of cirrhosis. Previous workup appears suspicious for hepatorenal syndrome.  She still smokes cigarettes. Quit alcohol years ago. No history of viral hepatitis.  PAST MEDICAL HISTORY :   has a past medical history of Cirrhosis (12/2013) and Renal disorder.  has past surgical history that includes Cholecystectomy and ORIF ankle fracture bimalleolar (Left, 2003). Prior to Admission medications   Medication Sig Start Date End Date Taking? Authorizing Provider  feeding supplement, ENSURE COMPLETE, (ENSURE COMPLETE) LIQD Take 237 mLs by mouth 2 (two) times daily between meals. 01/01/14  Yes Thurnell Lose, MD  folic acid (FOLVITE) 1 MG tablet Take 1 tablet (1 mg total) by mouth daily. 01/01/14  Yes Thurnell Lose, MD  lactulose (CHRONULAC) 10 GM/15ML solution Take 30 mLs (20 g total) by mouth 2 (two) times daily. 01/01/14  Yes Thurnell Lose, MD   pantoprazole (PROTONIX) 40 MG tablet Take 1 tablet (40 mg total) by mouth 2 (two) times daily. 01/01/14  Yes Thurnell Lose, MD  thiamine 100 MG tablet Take 1 tablet (100 mg total) by mouth daily. 01/01/14  Yes Thurnell Lose, MD  ondansetron (ZOFRAN) 4 MG tablet Take 1 tablet (4 mg total) by mouth every 6 (six) hours as needed for nausea. Patient not taking: Reported on 01/04/2014 01/01/14   Thurnell Lose, MD   Allergies  Allergen Reactions  . Percocet [Oxycodone-Acetaminophen] Nausea Only  . Vicodin [Hydrocodone-Acetaminophen] Nausea Only    FAMILY HISTORY:  has no family status information on file.  SOCIAL HISTORY:  reports that she has been smoking Cigarettes.  She has a 50 pack-year smoking history. She uses smokeless tobacco. She reports that she drinks alcohol. She reports that she does not use illicit drugs.  REVIEW OF SYSTEMS:  No fever, no chills, no sweats, no vomiting. No rash, no chest pain, no HA, no vision changes. Has diarrhea from lactulose.  SUBJECTIVE:   VITAL SIGNS: Temp:  [96.1 F (35.6 C)-97.8 F (36.6 C)] 97.8 F (36.6 C) (11/23 0027) Pulse Rate:  [87-97] 97 (11/23 0038) Resp:  [18-23] 22 (11/23 0038) BP: (92-118)/(52-65) 99/63 mmHg (11/23 0038) SpO2:  [93 %-96 %] 93 % (11/23 0038) Weight:  [49.896 kg (110 lb)] 49.896 kg (110 lb) (11/22 2134) HEMODYNAMICS:   VENTILATOR SETTINGS:   INTAKE / OUTPUT:  Intake/Output Summary (Last 24 hours) at 01/05/14 0103 Last data filed at 01/05/14 0037  Gross per 24 hour  Intake      0 ml  Output     50 ml  Net    -50 ml    PHYSICAL EXAMINATION: General:  Appears very weak, slightly drowsy but easily arousable, follows commands well, oriented x 3 Neuro:  No focal weakness, speech normal, no facial droop HEENT:  Atraumatic, no stridor, EOM full and equal Cardiovascular:  RRR, no loud murmur Lungs:  Decreased breath sounds bilaterally, no wheeze Abdomen:  Soft, distended, no guarding, has palpable liver  at RUQ Musculoskeletal:  No deformities, grade 2 edema both ankles Skin:  No ecchymosis, no rash  LABS:  CBC  Recent Labs Lab 12/30/13 0533 01/01/14 0536 01/04/14 2300 01/04/14 2336  WBC 15.5* 17.1* 15.3*  --   HGB 10.4* 10.9* 11.1* 10.9*  HCT 32.1* 33.0* 34.1* 32.0*  PLT 70* 44* 108*  --    Coag's No results for input(s): APTT, INR in the last 168 hours. BMET  Recent Labs Lab 12/31/13 0046 01/01/14 0536 01/04/14 2324 01/04/14 2336  NA 134* 140 135* 133*  K 4.1 3.7 5.5* 5.1  CL 102 98 92* 98  CO2 17* 25 15*  --   BUN 51* 48* 78* 69*  CREATININE 3.08* 2.90* 5.29* 6.00*  GLUCOSE 145* 103* 162* 161*   Electrolytes  Recent Labs Lab 12/31/13 0046 01/01/14 0536 01/04/14 2324  CALCIUM 8.2* 8.4 9.4  PHOS 4.6 4.1  --    Sepsis Markers  Recent Labs Lab 01/04/14 2319  LATICACIDVEN 11.07*   ABG No results for input(s): PHART, PCO2ART, PO2ART in the last 168 hours. Liver Enzymes  Recent Labs Lab 12/30/13 0533 12/31/13 0046 01/01/14 0536 01/04/14 2324  AST 80*  --  57* 98*  ALT 24  --  21 23  ALKPHOS 636*  --  562* 400*  BILITOT 2.8*  --  3.3* 3.7*  ALBUMIN 1.2* 1.2* 1.1*  1.1* 1.0*   Cardiac Enzymes  Recent Labs Lab 12/31/13 0046 01/04/14 2300  TROPONINI <0.30  --   PROBNP  --  6795.0*   Glucose  Recent Labs Lab 12/29/13 0747 12/30/13 1217 01/04/14 2305 01/04/14 2309 01/05/14 0017  GLUCAP 143* 107* 15* 11* 88    Imaging No results found.   ASSESSMENT / PLAN:  PULMONARY A: dyspnea due to pulmonary edema due to third spacing from severely low albumin, unknown if there is any heart failure component P:   Minimize crystalloids, will give albumin for now although this is temporary in nature Not a candidate for diuresis due to likely hepatorenal syndrome Oxygen therapy as needed If she continues to get worse may need intubation at some point  CARDIOVASCULAR A: hypotension likely related to decompensated liver failure and third  spacing, unknown if there is heart failure component P:  See above (pulmonary plans)  RENAL A:  AKI on CKD, very likely hepatorenal syndrome P:   Give albumin and midodrine. No diuretic for now Monitor electrolytes, shifting maneuver for hyperkalemia, renal diet as tolerated Likely not a candidate for dialysis in view of her overall picture  GASTROINTESTINAL A:  Decompensated liver failure from cirrhosis + adenocarcinoma, mild hepatic encephalopathy P:   Does Not appear to be a candidate for chemo in her current condition. May consider Oncology consult in AM if desired Continue lactulose goal 2 loose BM per day Obviously not a liver transplant candidate due to malignancy GI prophylaxis. Give thiamine, folate, MVI Check INR and aPTT  HEMATOLOGIC A:  Chronic illness anemia P:  Monitor, goal Hb 7  INFECTIOUS A:  Less likely to be sepsis considering her overall picture but cannot be  ruled out yet for now P:   Will give abx for now until ruled out, follow up culture BCx2  UC  Sputum  Abx: zosyn 01/04/14 >>>  ENDOCRINE A:  No acute issues P:   Blood sugar control 140-180  NEUROLOGIC A:  Mild hepatic encephalopathy P:   Continue lactulose goal 2 loose BM per day   FAMILY  - Updates: I have told 1 of the daughters about the patient's condition and her likely grim prognosis. If she gets worse she may need intubation at some point. She is currently full code but I advised the daughter to talk to the rest of the family members to see what course of action they want to take if things get worse.   TODAY'S SUMMARY: admitted in ICU for hypotension, high lactate, renal failure in the setting of cirrhosis and liver cancer. Overall picture suggests decompensated liver failure + hepatorenal sydrome + severe third spacing. Less likely sepsis.    Critical care time: 35 minutes  [facility] [place] Pager: [phone]  01/05/2014, 1:03  AM

## 2014-01-06 ENCOUNTER — Inpatient Hospital Stay (HOSPITAL_COMMUNITY): Payer: Medicare Other

## 2014-01-06 DIAGNOSIS — E43 Unspecified severe protein-calorie malnutrition: Secondary | ICD-10-CM

## 2014-01-06 DIAGNOSIS — Z515 Encounter for palliative care: Secondary | ICD-10-CM

## 2014-01-06 DIAGNOSIS — R531 Weakness: Secondary | ICD-10-CM

## 2014-01-06 LAB — BASIC METABOLIC PANEL
Anion gap: 22 — ABNORMAL HIGH (ref 5–15)
BUN: 74 mg/dL — AB (ref 6–23)
CALCIUM: 9.7 mg/dL (ref 8.4–10.5)
CO2: 24 mEq/L (ref 19–32)
Chloride: 95 mEq/L — ABNORMAL LOW (ref 96–112)
Creatinine, Ser: 5.18 mg/dL — ABNORMAL HIGH (ref 0.50–1.10)
GFR calc non Af Amer: 8 mL/min — ABNORMAL LOW (ref >90)
GFR, EST AFRICAN AMERICAN: 9 mL/min — AB (ref >90)
Glucose, Bld: 100 mg/dL — ABNORMAL HIGH (ref 70–99)
POTASSIUM: 3.9 meq/L (ref 3.7–5.3)
Sodium: 141 mEq/L (ref 137–147)

## 2014-01-06 LAB — URINE CULTURE
COLONY COUNT: NO GROWTH
CULTURE: NO GROWTH

## 2014-01-06 LAB — CORTISOL: Cortisol, Plasma: 23.6 ug/dL

## 2014-01-06 LAB — CBC
HCT: 28.6 % — ABNORMAL LOW (ref 36.0–46.0)
Hemoglobin: 9.3 g/dL — ABNORMAL LOW (ref 12.0–15.0)
MCH: 32.1 pg (ref 26.0–34.0)
MCHC: 32.5 g/dL (ref 30.0–36.0)
MCV: 98.6 fL (ref 78.0–100.0)
PLATELETS: 46 10*3/uL — AB (ref 150–400)
RBC: 2.9 MIL/uL — AB (ref 3.87–5.11)
RDW: 21.1 % — ABNORMAL HIGH (ref 11.5–15.5)
WBC: 13.6 10*3/uL — ABNORMAL HIGH (ref 4.0–10.5)

## 2014-01-06 MED ORDER — SODIUM CHLORIDE 0.9 % IJ SOLN
10.0000 mL | INTRAMUSCULAR | Status: DC | PRN
  Administered 2014-01-07 – 2014-01-09 (×3): 10 mL
  Filled 2014-01-06 (×3): qty 40

## 2014-01-06 MED ORDER — FENTANYL CITRATE 0.05 MG/ML IJ SOLN: 25.0000 ug | INTRAMUSCULAR | Status: DC | PRN

## 2014-01-06 MED ORDER — NEPRO/CARBSTEADY PO LIQD
237.0000 mL | Freq: Three times a day (TID) | ORAL | Status: DC
  Administered 2014-01-07 – 2014-01-08 (×4): 237 mL via ORAL
  Filled 2014-01-06 (×12): qty 237

## 2014-01-06 MED ORDER — MORPHINE SULFATE 2 MG/ML IJ SOLN: 1.0000 mg | INTRAMUSCULAR | Status: DC | PRN

## 2014-01-06 NOTE — Progress Notes (Signed)
Pt transferred to 6N 04 via bed.  VSS.  Family aware of transfer.  Shaune Leeks, NP was notified that we are waiting for family to arrive but as of this time they are not all here for family meeting.

## 2014-01-06 NOTE — Plan of Care (Signed)
Problem: Phase I Progression Outcomes Goal: Pain controlled with appropriate interventions Outcome: Progressing     

## 2014-01-06 NOTE — Consult Note (Signed)
Patient [name]      DOB: 06/14/1945      JSE:[id number]     Consult Note from the Palliative Medicine Team at [facility] Requested by: Dr. Alva Garnet     PCP: Pcp Not In System Reason for Consultation: [place]     Phone Number:None  Assessment of patients Current state: After speaking with [name] briefly yesterday she had decided on no resuscitation. I have shared this information with [name]. I spoke again with Lauren today with [name] at bedside. I spoke briefly with [name] yesterday and they understand poor prognosis. I explained that Lauren liver and kidneys are failing and that there are no real options to treat the failure or Lauren cancer and prognosis is very poor. I told him that he should make the best out of the time she has left - which is likely very limited. We discussed that focusing on Lauren comfort is really the best option for Lauren as this is not going to get better. He tells me that he has struggled with this information but took some time and prayed and processed this information. He tells me that he knows he wants to keep his mother here 150 years but he also says he knows that is for him and not for Lauren. He sounds like he is in a better place and family is processing and preparing for the inevitable. He tells me that his siblings plan on coming today and I offered to come and help them discuss how to best move forward. I even mentioned hospice options to him but [name] was fading in and out of sleep. I told them that they can consider hospice at home or in a facility. No decisions made - I have made numerous attempts to meet with all Lauren siblings together to better discuss but they have not been able/willing to do this so far. I have spoken to 3 out of 4 of [name]. I will continue to follow and be available to meet with them all together.    Goals of Care: 1.  Code Status: Partial: No CPR/shock/intubation. Consider BiPAP  and vasopressors/antiarrythmics.    2. Disposition: To be determined.    3. Symptom Management:   1. Weakness: Continue support treatment.  2. Pain: Fentanyl 25 mcg every 2 hours prn.  3. Bowel Regimen: Lactulose daily.  4. Fever: Acetaminophen prn.  5. Nausea/Vomiting: Ondansetron prn.   4. Psychosocial: Emotional support provided to patient and family.   5. Spiritual: She is very religious and asks for prayer. Chaplain consulted for support.    Brief HPI: 68 yo female admitted with leg swelling and weakness. 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 She is also having acute renal failure and is not a candidate for dialysis with likely hepatorenal syndrome with no reversible causes.    ROS: Denies pain, nausea, anxiety.     PMH:  Past Medical History  Diagnosis Date  . Cirrhosis 12/2013  . Renal disorder   . COPD (chronic obstructive pulmonary disease)      PSH: Past Surgical History  Procedure Laterality Date  . Cholecystectomy    . Orif ankle fracture bimalleolar Left 2003   I have reviewed the [place] and [facility] and  If appropriate update it with new information. Allergies  Allergen Reactions  . Percocet [Oxycodone-Acetaminophen] Nausea Only  . Vicodin [Hydrocodone-Acetaminophen] Nausea Only   Scheduled Meds: .  folic acid  1 mg Oral Daily  . lactulose  10 g Oral Daily  . midodrine  10 mg Oral TID WC  . multivitamin  5 mL Oral Daily  . sevelamer carbonate  800 mg Oral TID WC  . sodium chloride  10-40 mL Intracatheter Q12H  . thiamine  100 mg Oral Daily   Continuous Infusions: . norepinephrine (LEVOPHED) Adult infusion Stopped (01/06/14 1145)   PRN Meds:.sodium chloride, acetaminophen, albuterol, morphine injection, ondansetron (ZOFRAN) IV, sodium chloride    BP 95/53 mmHg  Pulse 74  Temp(Src) 97 F (36.1 C) (Oral)  Resp 19  Ht 5\' 5"  (1.651 m)  Wt 57.5 kg (126 lb 12.2 oz)  BMI 21.09 kg/m2   SpO2 96%   PPS: 20%   Intake/Output Summary (Last 24 hours) at 01/06/14 1219 Last data filed at 01/06/14 1145  Gross per 24 hour  Intake 1874.29 ml  Output    104 ml  Net 1770.29 ml   LBM: 01/05/14  Physical Exam:  General: NAD, weak, thin, frail HEENT: Temporal muscle wasting, moist mucous membranes Chest: No labored breathing, shallow CVS: RRR, S1 S2 Abdomen: Soft, NT, ND Ext: MAE, BLE 3+ edema, warm to touch Neuro: Lethargic, oriented x 3  Labs: CBC    Component Value Date/Time   WBC 13.6* 01/06/2014 0503   RBC 2.90* 01/06/2014 0503   RBC 2.39* 12/27/2013 2122   HGB 9.3* 01/06/2014 0503   HCT 28.6* 01/06/2014 0503   PLT 46* 01/06/2014 0503   MCV 98.6 01/06/2014 0503   MCH 32.1 01/06/2014 0503   MCHC 32.5 01/06/2014 0503   RDW 21.1* 01/06/2014 0503   LYMPHSABS 0.6* 01/04/2014 2300   MONOABS 1.7* 01/04/2014 2300   EOSABS 0.0 01/04/2014 2300   BASOSABS 0.0 01/04/2014 2300    BMET    Component Value Date/Time   NA 141 01/06/2014 0503   K 3.9 01/06/2014 0503   CL 95* 01/06/2014 0503   CO2 24 01/06/2014 0503   GLUCOSE 100* 01/06/2014 0503   BUN 74* 01/06/2014 0503   CREATININE 5.18* 01/06/2014 0503   CALCIUM 9.7 01/06/2014 0503   GFRNONAA 8* 01/06/2014 0503   GFRAA 9* 01/06/2014 0503    CMP     Component Value Date/Time   NA 141 01/06/2014 0503   K 3.9 01/06/2014 0503   CL 95* 01/06/2014 0503   CO2 24 01/06/2014 0503   GLUCOSE 100* 01/06/2014 0503   BUN 74* 01/06/2014 0503   CREATININE 5.18* 01/06/2014 0503   CALCIUM 9.7 01/06/2014 0503   PROT 4.6* 01/05/2014 0432   ALBUMIN 1.5* 01/05/2014 0432   AST 127* 01/05/2014 0432   ALT 30 01/05/2014 0432   ALKPHOS 361* 01/05/2014 0432   BILITOT 3.9* 01/05/2014 0432   GFRNONAA 8* 01/06/2014 0503   GFRAA 9* 01/06/2014 0503     Time In Time Out Total Time Spent with Patient Total Overall Time  1100 1220 36min 12min    Greater than 50%  of this time was spent counseling and coordinating care  related to the above assessment and plan.  [name], NP Palliative Medicine Team Pager # [phone] (M-F 8a-5p) Team Phone # [phone] (Nights/Weekends)

## 2014-01-06 NOTE — Progress Notes (Addendum)
PULMONARY / CRITICAL CARE MEDICINE   Name: [name] MRN: [id number] DOB: December 03, 1945    ADMISSION DATE:  01/04/2014 CONSULTATION DATE:  01/05/14  REFERRING MD:  ED  INITIAL PRESENTATION: Leg swelling and weakness  STUDIES:  Liver biopsy 12/30/13: adenocarcinoma CXR 01/04/14: 1. Congestive heart failure. 2. Nonobstructive bowel gas pattern.  SIGNIFICANT EVENTS: 11/22 admitted 11/23 DNR/DNI, oncology/palliative consults  SUBJECTIVE:  Pt reports feeling "alright", no current complaints. Minimal UOP over past 24hrs.   VITAL SIGNS: Temp:  [97.2 F (36.2 C)-97.9 F (36.6 C)] 97.9 F (36.6 C) (11/24 0749) Pulse Rate:  [72-84] 76 (11/24 0800) Resp:  [11-26] 18 (11/24 0800) BP: (77-118)/(45-79) 89/52 mmHg (11/24 0800) SpO2:  [88 %-100 %] 94 % (11/24 0800) Weight:  [57.5 kg (126 lb 12.2 oz)] 57.5 kg (126 lb 12.2 oz) (11/24 0600) HEMODYNAMICS:   VENTILATOR SETTINGS:   INTAKE / OUTPUT:  Intake/Output Summary (Last 24 hours) at 01/06/14 0830 Last data filed at 01/06/14 0800  Gross per 24 hour  Intake 3013.47 ml  Output     94 ml  Net 2919.47 ml    PHYSICAL EXAMINATION: General:  NAD, appears weak Neuro:  Alert and oriented x 3. No focal abnormality noted. Follows commands HEENT:  NCAT, PERRL, EOMI Cardiovascular:  RRR, quiet s1/s2, no m/r/g appreciated Lungs:  Decreased breath sounds bilaterally, no wheezes Abdomen:  Soft, distended, no guarding, liver edge palpable 10cm below rib, hypoactive bowel sounds Musculoskeletal:  No deformities, 3+ edema both ankles. Feet WWP. Skin:  No ecchymosis, no rash  LABS:  CBC  Recent Labs Lab 01/04/14 2300 01/04/14 2336 01/05/14 0432 01/06/14 0503  WBC 15.3*  --  14.4* 13.6*  HGB 11.1* 10.9* 8.6* 9.3*  HCT 34.1* 32.0* 26.3* 28.6*  PLT 108*  --  59* 46*   Coag's  Recent Labs Lab 01/05/14 0432  APTT 55*  INR 2.30*   BMET  Recent Labs Lab 01/04/14 2324 01/04/14 2336 01/05/14 0432 01/06/14 0503  NA  135* 133* 142 141  K 5.5* 5.1 4.7 3.9  CL 92* 98 96 95*  CO2 15*  --  22 24  BUN 78* 69* 78* 74*  CREATININE 5.29* 6.00* 5.17* 5.18*  GLUCOSE 162* 161* 119* 100*   Electrolytes  Recent Labs Lab 12/31/13 0046 01/01/14 0536 01/04/14 2324 01/05/14 0432 01/06/14 0503  CALCIUM 8.2* 8.4 9.4 9.4 9.7  MG  --   --   --  2.6*  --   PHOS 4.6 4.1  --  7.1*  --    Sepsis Markers  Recent Labs Lab 01/04/14 2319 01/05/14 0432  LATICACIDVEN 11.07* 7.1*   ABG No results for input(s): PHART, PCO2ART, PO2ART in the last 168 hours. Liver Enzymes  Recent Labs Lab 01/01/14 0536 01/04/14 2324 01/05/14 0432  AST 57* 98* 127*  ALT 21 23 30   ALKPHOS 562* 400* 361*  BILITOT 3.3* 3.7* 3.9*  ALBUMIN 1.1*  1.1* 1.0* 1.5*   Cardiac Enzymes  Recent Labs Lab 12/31/13 0046 01/04/14 2300  TROPONINI <0.30  --   PROBNP  --  6795.0*   Glucose  Recent Labs Lab 12/30/13 1217 01/04/14 2305 01/04/14 2309 01/05/14 0017 01/05/14 0257 01/05/14 0822  GLUCAP 107* 15*  15* 11*  11* 88 124* 117*    Imaging Dg Abd Acute W/chest  01/05/2014   CLINICAL DATA:  Abdominal pain.  History of renal disorder  EXAM: ACUTE ABDOMEN SERIES (ABDOMEN 2 VIEW & CHEST 1 VIEW)  COMPARISON:  None.  FINDINGS: No abnormal  bowel dilatation identified. Cholecystectomy clips noted in the right upper quadrant. The heart size appears normal. There are small bilateral pleural effusions and mild interstitial edema.  IMPRESSION: 1. Congestive heart failure. 2. Nonobstructive bowel gas pattern.   Electronically Signed   By: Kerby Moors M.D.   On: 01/05/2014 00:12     ASSESSMENT / PLAN: PULMONARY A:  Pulmonary edema: likely third spacing low albumin P:   Not a candidate for diuresis due to likely hepatorenal syndrome Oxygen therapy as needed  CARDIOVASCULAR Right PICC 11/24 A:  Hypotension  P:  See above (pulmonary plans) Norepi drip to maintain MAP>55 or SBP >90; WEAN Continue midodrine  RENAL A:    AKI on CKD, suspect hepatorenal syndrome Hyperphosphatemia P:   Give albumin and midodrine.  HOLD diuretics Renal diet as tolerated Not an HD candidate Sevelamer  GASTROINTESTINAL A:   Decompensated liver failure from cirrhosis + adenocarcinoma Mild hepatic encephalopathy P:   Continue lactulose goal 2 loose BM per day Stop gi proph Not a candidate for systemic chemo tx  HEMATOLOGIC A:   Anemia of chronic disease Thrombocytopenia P:  Monitor, goal Hb 7 SCDs  INFECTIOUS A:   Sepsis ruleout P:   BCx2  11/23 >> UC  11/23 >> no growth Abx:  Zosyn 01/04/14 >> 11/24  ENDOCRINE A:   No acute issues P:   Monitor  NEUROLOGIC A:   Mild hepatic encephalopathy P:   Continue lactulose goal 2 loose BM per day Morphine prn  FAMILY  - Updates: Initial palliative discussion 11/23, DNR/DNI.  TODAY'S SUMMARY: 68 y.o. female with decompensated liver failure and AKI/HRS. DNR/DNI, oncology recs hospice evaluation.  Will wean pressor support and transfer to palliative floor   Tawanna Sat, MD 01/06/2014, 8:30 AM PGY-2, [facility]   PCCM ATTENDING: Pt seen on work rounds with care provider noted above. We reviewed pt's initial presentation, consultants notes and hospital database in detail.  The above assessment and plan was formulated under my direction.  In summary: Metastatic adenocarcinoma Not a candidate for chemotherapy AKI/CKD - not a candidate for HD She is now off vasopressors Palliative Care has seen Will transfer out of ICU and TRH to resume her care as of AM 11/25. PCCM will sign off at that time  Discussed with Dr Marton Redwood, MD;  PCCM service; Mobile [phone]

## 2014-01-06 NOTE — Progress Notes (Signed)
Received patient from 21m. Alert and oriented X 1, not in any distress, O2 at 2LPM. Oriented to unit, family at bedside.Paged Palliative MD per family request for the meeting.

## 2014-01-07 ENCOUNTER — Ambulatory Visit: Payer: Medicare Other | Admitting: Internal Medicine

## 2014-01-07 DIAGNOSIS — R740 Nonspecific elevation of levels of transaminase and lactic acid dehydrogenase [LDH]: Secondary | ICD-10-CM

## 2014-01-07 DIAGNOSIS — C801 Malignant (primary) neoplasm, unspecified: Secondary | ICD-10-CM | POA: Diagnosis present

## 2014-01-07 DIAGNOSIS — R7401 Elevation of levels of liver transaminase levels: Secondary | ICD-10-CM | POA: Diagnosis present

## 2014-01-07 DIAGNOSIS — K7460 Unspecified cirrhosis of liver: Secondary | ICD-10-CM

## 2014-01-07 NOTE — Progress Notes (Signed)
Patient ID: [name]  female  OHY:[id number]    DOB: December 23, 1945    DOA: 01/04/2014  PCP: Pcp Not In System  Brief history of present illness  Patient is a 68 year old female with chronic renal failure, cirrhosis diagnosed in 12/2013, history of alcohol use who presented to ED with feeling of generalized weakness. 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 Patient was having poor by mouth intake and her family not herself and swollen. Patient's daughter had reported that patient's legs and abdominal swelling had worsened, had difficulty ambulating, short of breath. She had quit alcohol years ago. Patient was noted to have creatinine of 5.29, (2.9 at the time of discharge on 11/19). Lactic acid 11.0, elevated LFTs, alkaline phosphatase 400, AST 98, ALT 23, bilirubin 3.7, albumin 1.0.  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  Patient was transferred to the floor on 01/06/14, hospitalist service assumed care on 01/07/14.       /Plan: Active Problems:   AKI (acute kidney injury) on  chronic renal failure in the setting of liver cirrhosis, hepatorenal syndrome - No significant improvement, creatinine 5.1 on 11/24, 5.29 at the time of admission - Patient was seen by nephrology on 11/23, recommended conservative care, poor candidate for RRT given her comorbidities  Hypotension - Continue midodrine  Liver failure, transaminitis, in the setting of adenocarcinoma and cirrhosis, mild hepatic encephalopathy - Continue lactulose - Not a candidate for systemic chemotherapy - Per oncology consult, poor prognosis, hospice evaluation to concentrate on quality of life, comfort care - 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    Protein-calorie malnutrition, failure to thrive, cachexia -  Overall poor prognosis, family somewhat unrealistic to understand despite multiple attempts by oncology, palliative medicine and myself   DVT Prophylaxis:  Code Status: partial code  Family Communication: dicussed with multiple family members in the room   Disposition:  Consultants Nephrology  Oncology   Antibiotics:  none    Subjective:Patient seen and examined, no significant complaints by the patient  Objective Weight change:   Intake/Output Summary (Last 24 hours) at 01/07/14 1306 Last data filed at 01/07/14 0646  Gross per 24 hour  Intake     20 ml  Output    175 ml  Net   -155 ml   Blood pressure 89/57, pulse 72, temperature 97.9 F (36.6 C), temperature source Oral, resp. rate 16, height $RemoveBe'5\' 5"'CrKZhaKze$  (1.651 m), weight 57.5 kg (126 lb 12.2 oz), SpO2 97 %.  Physical Exam: General: Alert and awake, oriented, NAD, cachectic  CVS: S1-S2 clear, no murmur rubs or gallops Chest: Decreased breath sounds at the bases Abdomen: soft distended, hepatomegaly, hypoactive bowel sounds   Extremities: no cyanosis, clubbing, 3+ edema noted bilaterally   Lab Results: Basic Metabolic Panel:  Recent Labs Lab 01/05/14 0432 01/06/14 0503  NA 142 141  K 4.7 3.9  CL 96 95*  CO2 22 24  GLUCOSE 119* 100*  BUN 78* 74*  CREATININE 5.17* 5.18*  CALCIUM 9.4 9.7  MG 2.6*  --   PHOS 7.1*  --    Liver Function Tests:  Recent Labs Lab 01/04/14 2324 01/05/14 0432  AST 98* 127*  ALT 23 30  ALKPHOS 400* 361*  BILITOT 3.7*  3.9*  PROT 4.4* 4.6*  ALBUMIN 1.0* 1.5*   No results for input(s): LIPASE, AMYLASE in the last 168 hours.  Recent Labs Lab 01/04/14 2326  AMMONIA 85*   CBC:  Recent Labs Lab 01/04/14 2300  01/05/14 0432 01/06/14 0503  WBC 15.3*  --  14.4* 13.6*  NEUTROABS 13.0*  --   --   --   HGB 11.1*  < > 8.6* 9.3*    HCT 34.1*  < > 26.3* 28.6*  MCV 97.7  --  95.6 98.6  PLT 108*  --  59* 46*  < > = values in this interval not displayed. Cardiac Enzymes: No results for input(s): CKTOTAL, CKMB, CKMBINDEX, TROPONINI in the last 168 hours. BNP: Invalid input(s): POCBNP CBG:  Recent Labs Lab 01/04/14 2305 01/04/14 2309 01/05/14 0017 01/05/14 0257 01/05/14 0822  GLUCAP 15*  15* 11*  11* 88 124* 117*     Micro Results: Recent Results (from the past 240 hour(s))  Urine culture     Status: None   Collection Time: 12/28/13  5:01 PM  Result Value Ref Range Status   Specimen Description URINE, CATHETERIZED  Final   Special Requests NONE  Final   Culture  Setup Time   Final    12/29/2013 01:11 Performed at [place] Performed at [facility]   Final   Culture NO GROWTH Performed at [facility]   Final   Report Status 12/30/2013 FINAL  Final  Blood culture (routine x 2)     Status: None (Preliminary result)   Collection Time: 01/04/14 11:27 PM  Result Value Ref Range Status   Specimen Description LEFT ANTECUBITAL  Final   Special Requests BOTTLES DRAWN AEROBIC AND ANAEROBIC [facility] EACH  Final   Culture  Setup Time   Final    01/05/2014 10:13 Performed at [facility]    Culture   Final           BLOOD CULTURE RECEIVED NO GROWTH TO DATE CULTURE WILL BE HELD FOR 5 DAYS BEFORE ISSUING A FINAL NEGATIVE REPORT Performed at [facility]    Report Status PENDING  Incomplete  Blood culture (routine x 2)     Status: None (Preliminary result)   Collection Time: 01/04/14 11:38 PM  Result Value Ref Range Status   Specimen Description BLOOD LEFT HAND  Final   Special Requests BOTTLES DRAWN AEROBIC AND ANAEROBIC 5CC EACH  Final   Culture  Setup Time   Final    01/05/2014 10:13 Performed at [facility]    Culture   Final           BLOOD CULTURE RECEIVED NO GROWTH TO DATE CULTURE WILL BE HELD FOR 5 DAYS BEFORE ISSUING  A FINAL NEGATIVE REPORT Performed at [facility]    Report Status PENDING  Incomplete  Urine culture     Status: None   Collection Time: 01/05/14 12:35 AM  Result Value Ref Range Status   Specimen Description URINE, CATHETERIZED  Final   Special Requests NONE  Final   Culture  Setup Time   Final    01/05/2014 12:08 Performed at [place] Performed at [facility]   Final   Culture NO GROWTH Performed at [facility]   Final   Report Status 01/06/2014 FINAL  Final  MRSA PCR Screening     Status: None  Collection Time: 01/05/14  3:16 AM  Result Value Ref Range Status   MRSA by PCR NEGATIVE NEGATIVE Final    Comment:        The GeneXpert MRSA Assay (FDA approved for NASAL specimens only), is one component of a comprehensive MRSA colonization surveillance program. It is not intended to diagnose MRSA infection nor to guide or monitor treatment for MRSA infections.     Studies/Results: Ct Abdomen Pelvis Wo Contrast  12/27/2013   CLINICAL DATA:  Lower abdominal pain for 10 days, status postcholecystectomy  EXAM: CT ABDOMEN AND PELVIS WITHOUT CONTRAST  TECHNIQUE: Multidetector CT imaging of the abdomen and pelvis was performed following the standard protocol without IV contrast.  COMPARISON:  07/28/2007.  FINDINGS: Lung bases are unremarkable. Sagittal images of the spine are unremarkable. There is small perihepatic ascites. There is hepatomegaly. The liver measures at least 22 cm in length. There is micro nodular liver contour highly suspicious for chronic liver disease or early cirrhosis.  The study is limited without IV contrast. The patient is status postcholecystectomy.  Atherosclerotic calcifications of abdominal aorta and iliac arteries. Unenhanced pancreas and spleen is unremarkable. The spleen measures 8.9 cm in length.  Adrenal glands are unremarkable. Unenhanced kidneys are symmetrical in size. There is a  probable cyst in midpole of the right kidney measures 2 cm. No nephrolithiasis. No hydronephrosis or hydroureter. No calcified ureteral calculi are noted. Small ascites is noted in right paracolic gutter.  Small pelvic ascites. Oral contrast material was given to the patient. No small bowel obstruction. No colonic obstruction. Contrast material noted all the way in rectosigmoid colon.  There is small left inguinal hernia containing ascites measures about 1.2 cm. There is old fracture deformity of the left inferior pubic ramus.  The uterus is not identified.  No inguinal adenopathy.  IMPRESSION: 1. There is perihepatic ascites. Significant hepatomegaly. There is micro nodular liver contour. Chronic liver disease or cirrhosis cannot be excluded. Clinical correlation is necessary. 2. The patient is status post cholecystectomy.  No splenomegaly. 3. There is a cyst in midpole of the right kidney measures 2 cm. No nephrolithiasis. No hydronephrosis or hydroureter. 4. Atherosclerotic calcifications of abdominal aorta and iliac arteries. 5. Small pelvic ascites. The uterus is not identified. No small bowel or colonic obstruction.   Electronically Signed   By: Lahoma Crocker M.D.   On: 12/27/2013 18:31   Dg Chest 2 View  12/27/2013   CLINICAL DATA:  Lower abdominal pain and lower extremity swelling  EXAM: CHEST  2 VIEW  COMPARISON:  11/02/2007  FINDINGS: Cardiac shadow is within normal limits. The lungs are hyperaerated bilaterally. No focal infiltrate or sizable effusion is seen.  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   Electronically Signed   By: Inez Catalina M.D.   On: 12/27/2013 16:16   US Abdomen Complete  12/28/2013   CLINICAL DATA:  Elevated liver function tests. Acute renal insufficiency. Ethanol abuse.  EXAM: ULTRASOUND ABDOMEN COMPLETE  COMPARISON:  12/27/2013  FINDINGS: Gallbladder: Surgically absent  Common bile duct: Diameter: Normal, 5 mm.  Liver: Moderate cirrhosis. Heterogeneous echogenicity is  likely related to cirrhosis and possibly heterogeneous steatosis.  IVC: No abnormality visualized.  Pancreas: Visualized portion unremarkable.  Spleen: Size and appearance within normal limits.  Right Kidney: Length: 7.4 cm. Atrophic with increased echogenicity. No hydronephrosis. 1.9 cm interpolar cyst.  Left Kidney: Length: 7.8 cm. Atrophic and hyperechoic. 1.4 cm lower pole cyst.  Abdominal aorta: Atherosclerosis throughout the abdominal aorta.  Other findings:  Small volume perihepatic ascites.  IMPRESSION: 1. Cirrhosis and small volume ascites. 2. Atrophic kidneys, most consistent with medical renal disease. 3.  Cholecystectomy, without biliary ductal dilatation.   Electronically Signed   By: Abigail Miyamoto M.D.   On: 12/28/2013 16:12   Ir Venogram Hepatic W Hemodynamic Evaluation  12/31/2013   INDICATION: New diagnosis of cirrhosis. Now with ascites. Please perform transjugular liver biopsy for tissue diagnostic purposes.  EXAM: FLUOROSCOPIC GUIDED TRANSJUGULAR LIVER BIOPSY  ULTRASOUND GUIDANCE FOR VENOUS ACCESS  COMPARISON:  CT abdomen pelvis -12/27/2013  MEDICATIONS: Fentanyl 50 mcg IV; Versed 1 mg IV  CONTRAST:  None.  CO2 was utilized given renal insufficiency.  ANESTHESIA/SEDATION: Total Moderate Sedation Time  30 minutes  FLUOROSCOPY TIME:  2 minutes.  36 seconds.  COMPLICATIONS: None immediate  TECHNIQUE: Informed written consent was obtained from the patient after a discussion of the risks, benefits and alternatives to treatment. Questions regarding the procedure were encouraged and answered. A timeout was performed prior to the initiation of the procedure.  The right neck was prepped and draped in the usual sterile fashion, and a sterile drape was applied covering the operative field. Maximum barrier sterile technique with sterile gowns and gloves were used for the procedure. A timeout was performed prior to the initiation of the procedure. Local anesthesia was provided with 1% lidocaine with  epinephrine.  The right internal jugular vein was accessed with a micropuncture kit under direct ultrasound guidance. An ultrasound image was saved for documentation purposes. The micropuncture sheath was exchanged for a 9-French vascular sheath over a Benson wire. A 100 cm Bernstein catheter was advanced over a stiff Glidewire to select the middle hepatic vein. A CO2 hepatic venogram was performed. Free hepatic and right atrial pressures were obtained with the Berenstein catheter. The Berenstein catheter was exchanged for a Dextera transjugular biopsy sheath. 3 core needle biopsies were obtained and anterior to the middle hepatic vein under direct fluoroscopic guidance. Samples were deemed adequate, placed in formalin and submitted to pathology. At this point the procedure was terminated. All wires, catheters and sheaths removed and the patient. Hemostasis was achieved at the right neck access site with manual compression. A dressing was placed. The patient tolerated the procedure well without immediate postprocedural complication.  FINDINGS: Normal hepatic venogram.  Pressure measurements as follows:  Free hepatic  12/5 (mean - 8)  Wedged hepatic  24/7 (mean - 17)  Gradient  9  Successful acquisition of 3 core needle biopsies anterior to the middle hepatic vein.  IMPRESSION: 1. No portosystemic gradient to suggest portal venous hypertension. 2. Successful fluoroscopic guided transjugular liver biopsy. 3. Unremarkable hepatic CO2 venogram.   Electronically Signed   By: Sandi Mariscal M.D.   On: 12/31/2013 11:44   Ir Transcatheter Bx  12/31/2013   INDICATION: New diagnosis of cirrhosis. Now with ascites. Please perform transjugular liver biopsy for tissue diagnostic purposes.  EXAM: FLUOROSCOPIC GUIDED TRANSJUGULAR LIVER BIOPSY  ULTRASOUND GUIDANCE FOR VENOUS ACCESS  COMPARISON:  CT abdomen pelvis -12/27/2013  MEDICATIONS: Fentanyl 50 mcg IV; Versed 1 mg IV  CONTRAST:  None.  CO2 was utilized given renal  insufficiency.  ANESTHESIA/SEDATION: Total Moderate Sedation Time  30 minutes  FLUOROSCOPY TIME:  2 minutes.  36 seconds.  COMPLICATIONS: None immediate  TECHNIQUE: Informed written consent was obtained from the patient after a discussion of the risks, benefits and alternatives to treatment. Questions regarding the procedure were encouraged and answered. A timeout was performed prior to the initiation of the  procedure.  The right neck was prepped and draped in the usual sterile fashion, and a sterile drape was applied covering the operative field. Maximum barrier sterile technique with sterile gowns and gloves were used for the procedure. A timeout was performed prior to the initiation of the procedure. Local anesthesia was provided with 1% lidocaine with epinephrine.  The right internal jugular vein was accessed with a micropuncture kit under direct ultrasound guidance. An ultrasound image was saved for documentation purposes. The micropuncture sheath was exchanged for a 9-French vascular sheath over a Benson wire. A 100 cm Bernstein catheter was advanced over a stiff Glidewire to select the middle hepatic vein. A CO2 hepatic venogram was performed. Free hepatic and right atrial pressures were obtained with the Berenstein catheter. The Berenstein catheter was exchanged for a Dextera transjugular biopsy sheath. 3 core needle biopsies were obtained and anterior to the middle hepatic vein under direct fluoroscopic guidance. Samples were deemed adequate, placed in formalin and submitted to pathology. At this point the procedure was terminated. All wires, catheters and sheaths removed and the patient. Hemostasis was achieved at the right neck access site with manual compression. A dressing was placed. The patient tolerated the procedure well without immediate postprocedural complication.  FINDINGS: Normal hepatic venogram.  Pressure measurements as follows:  Free hepatic  12/5 (mean - 8)  Wedged hepatic  24/7 (mean -  17)  Gradient  9  Successful acquisition of 3 core needle biopsies anterior to the middle hepatic vein.  IMPRESSION: 1. No portosystemic gradient to suggest portal venous hypertension. 2. Successful fluoroscopic guided transjugular liver biopsy. 3. Unremarkable hepatic CO2 venogram.   Electronically Signed   By: Sandi Mariscal M.D.   On: 12/31/2013 11:44   Ir US Guide Vasc Access Right  12/31/2013   INDICATION: New diagnosis of cirrhosis. Now with ascites. Please perform transjugular liver biopsy for tissue diagnostic purposes.  EXAM: FLUOROSCOPIC GUIDED TRANSJUGULAR LIVER BIOPSY  ULTRASOUND GUIDANCE FOR VENOUS ACCESS  COMPARISON:  CT abdomen pelvis -12/27/2013  MEDICATIONS: Fentanyl 50 mcg IV; Versed 1 mg IV  CONTRAST:  None.  CO2 was utilized given renal insufficiency.  ANESTHESIA/SEDATION: Total Moderate Sedation Time  30 minutes  FLUOROSCOPY TIME:  2 minutes.  36 seconds.  COMPLICATIONS: None immediate  TECHNIQUE: Informed written consent was obtained from the patient after a discussion of the risks, benefits and alternatives to treatment. Questions regarding the procedure were encouraged and answered. A timeout was performed prior to the initiation of the procedure.  The right neck was prepped and draped in the usual sterile fashion, and a sterile drape was applied covering the operative field. Maximum barrier sterile technique with sterile gowns and gloves were used for the procedure. A timeout was performed prior to the initiation of the procedure. Local anesthesia was provided with 1% lidocaine with epinephrine.  The right internal jugular vein was accessed with a micropuncture kit under direct ultrasound guidance. An ultrasound image was saved for documentation purposes. The micropuncture sheath was exchanged for a 9-French vascular sheath over a Benson wire. A 100 cm Bernstein catheter was advanced over a stiff Glidewire to select the middle hepatic vein. A CO2 hepatic venogram was performed. Free hepatic  and right atrial pressures were obtained with the Berenstein catheter. The Berenstein catheter was exchanged for a Dextera transjugular biopsy sheath. 3 core needle biopsies were obtained and anterior to the middle hepatic vein under direct fluoroscopic guidance. Samples were deemed adequate, placed in formalin and submitted to pathology. At this  point the procedure was terminated. All wires, catheters and sheaths removed and the patient. Hemostasis was achieved at the right neck access site with manual compression. A dressing was placed. The patient tolerated the procedure well without immediate postprocedural complication.  FINDINGS: Normal hepatic venogram.  Pressure measurements as follows:  Free hepatic  12/5 (mean - 8)  Wedged hepatic  24/7 (mean - 17)  Gradient  9  Successful acquisition of 3 core needle biopsies anterior to the middle hepatic vein.  IMPRESSION: 1. No portosystemic gradient to suggest portal venous hypertension. 2. Successful fluoroscopic guided transjugular liver biopsy. 3. Unremarkable hepatic CO2 venogram.   Electronically Signed   By: Sandi Mariscal M.D.   On: 12/31/2013 11:44   Dg Chest Port 1 View  01/06/2014   CLINICAL DATA:  Respiratory failure.  EXAM: PORTABLE CHEST - 1 VIEW  COMPARISON:  01/04/2014, 12/27/2013, 11/02/2007.  FINDINGS: Right PICC line noted with tip cavoatrial junction. Mediastinum unremarkable. Persistent cardiomegaly with progressive pulmonary venous congestion. Bilateral pulmonary alveolar diffuse infiltrates consistent with pulmonary edema. Bilateral small pleural effusions. No pneumothorax. No acute bony abnormality.  IMPRESSION: 1. Congestive heart failure. Interval appearance of diffuse bilateral pulmonary alveolar edema. Bilateral pleural effusions. 2. PICC line in good anatomic position.   Electronically Signed   By: Marcello Moores  Register   On: 01/06/2014 07:17   Dg Abd Acute W/chest  01/05/2014   CLINICAL DATA:  Abdominal pain.  History of renal disorder   EXAM: ACUTE ABDOMEN SERIES (ABDOMEN 2 VIEW & CHEST 1 VIEW)  COMPARISON:  None.  FINDINGS: No abnormal bowel dilatation identified. Cholecystectomy clips noted in the right upper quadrant. The heart size appears normal. There are small bilateral pleural effusions and mild interstitial edema.  IMPRESSION: 1. Congestive heart failure. 2. Nonobstructive bowel gas pattern.   Electronically Signed   By: Kerby Moors M.D.   On: 01/05/2014 00:12    Medications: Scheduled Meds: . feeding supplement (NEPRO CARB STEADY)  237 mL Oral TID BM  . folic acid  1 mg Oral Daily  . lactulose  10 g Oral Daily  . midodrine  10 mg Oral TID WC  . multivitamin  5 mL Oral Daily  . sevelamer carbonate  800 mg Oral TID WC  . thiamine  100 mg Oral Daily      LOS: 3 days   Carisha Kantor M.D. [facility] 01/07/2014, 1:06 PM Pager: [phone]  If 7PM-7AM, please contact night-coverage [email or web address] Password [email or web address]

## 2014-01-07 NOTE — Clinical Social Work Note (Signed)
CSW referred patient to [facility]- no beds available, [facility] will contact when bed becomes available.  CSW will continue to follow.  Domenica Reamer, [place] Social Worker [phone]

## 2014-01-07 NOTE — Clinical Social Work Psychosocial (Signed)
Clinical Social Work Department BRIEF PSYCHOSOCIAL ASSESSMENT 01/07/2014  Patient:  [name], [name]     Account Number:  [id number]     Admit date:  01/04/2014  Clinical Social Worker:  Domenica Reamer, [place]  Date/Time:  01/07/2014 01:31 PM  Referred by:  Physician  Date Referred:  01/07/2014 Referred for  Residential hospice placement   Other Referral:   Interview type:  Patient Other interview type:   two of patients children also at bedside- Spanky and [name]    PSYCHOSOCIAL DATA Living Status:  FAMILY Admitted from facility:   Level of care:   Primary support name:  [name] Primary support relationship to patient:  CHILD, ADULT Degree of support available:   Patient appears to have high degree of support from her many adult children who were present at bedside this morning as well as during the interview.    CURRENT CONCERNS Current Concerns  Post-Acute Placement   Other Concerns:    SOCIAL WORK ASSESSMENT / PLAN CSW spoke with patient and patients children concerning Residential Hospice.  Patients children are agreeable to residential hospice and strongly prefer [facility]- one of the patients children came from [place] and wants to spend as much time as possible with the patient- there is also a lot of concern that patient will be discharged abruptly. Family understands that patient can not stay in the hospital until bed at [facility] is available and are agreeable to [place] referring patient is [facility] as well. CSW will continue to follow.   Assessment/plan status:  Psychosocial Support/Ongoing Assessment of Needs Other assessment/ plan:   Information/referral to community resources:   [facility]    PATIENT'S/FAMILY'S RESPONSE TO PLAN OF CARE: Patients family is agreeable to residential hospice placement but is very concerned patient will not be placed in [place] where all her family is.       Domenica Reamer, [place] Social Worker [phone]

## 2014-01-07 NOTE — Care Management Note (Signed)
  Page 1 of 1   01/07/2014     8:54:08 AM CARE MANAGEMENT NOTE 01/07/2014  Patient:  [name], [name]   Account Number:  [id number]  Date Initiated:  01/05/2014  Documentation initiated by:  [facility]  Subjective/Objective Assessment:   Admitted with increased leg and abd swelling - decreased LOC.     Action/Plan:   Anticipated DC Date:  01/09/2014   Anticipated DC Plan:  [place]  In-house referral  Clinical Social Worker      DC Planning Services  CM consult      Choice offered to / List presented to:             Status of service:  In process, will continue to follow Medicare Important Message given?  YES (If response is "NO", the following Medicare IM given date fields will be blank) Date Medicare IM given:  01/07/2014 Medicare IM given by:  Magdalen Spatz Date Additional Medicare IM given:   Additional Medicare IM given by:    Discharge Disposition:    Per UR Regulation:  Reviewed for med. necessity/level of care/duration of stay  If discussed at [place] of Stay Meetings, dates discussed:    Comments:  Contact:  Helms,Charlene Other [place] Daughter [phone]  01-05-14 [place], [place] [phone] Lives at home with daughter - recent dc and readmitted. Recently diagnosed with adenocarcinoma.  CM will continue to follow.  Palliative and onocology conults placed.

## 2014-01-07 NOTE — Progress Notes (Signed)
Progress Note from the Palliative Medicine Team at [facility]  Subjective:   This NP Wadie Lessen reviewed medical records, received report from team, assessed the patient and then meet at the patient's bedside along with her daughter Randell Patient, her sister and brother in law and a nephew  to discuss diagnosis prognosis, GOC, EOL wishes disposition and options.  A detailed discussion was had today regarding advanced directives.  Concepts specific to code status, artifical feeding and hydration, continued IV antibiotics and rehospitalization was had.  The difference between a aggressive medical intervention path  and a palliative comfort care path for this patient at this time was had.  Values and goals of care important to patient and family were attempted to be elicited.  Concept of Hospice and Palliative Care were discussed  Natural trajectory and expectations at EOL were discussed.  Questions and concerns addressed.  Hard Choices booklet left for review. Family encouraged to call with questions or concerns.  PMT will continue to support holistically.    Objective: Allergies  Allergen Reactions  . Percocet [Oxycodone-Acetaminophen] Nausea Only  . Vicodin [Hydrocodone-Acetaminophen] Nausea Only   Scheduled Meds: . feeding supplement (NEPRO CARB STEADY)  237 mL Oral TID BM  . folic acid  1 mg Oral Daily  . lactulose  10 g Oral Daily  . midodrine  10 mg Oral TID WC  . multivitamin  5 mL Oral Daily  . sevelamer carbonate  800 mg Oral TID WC  . thiamine  100 mg Oral Daily   Continuous Infusions:  PRN Meds:.acetaminophen, fentaNYL, ondansetron (ZOFRAN) IV, sodium chloride  BP 89/57 mmHg  Pulse 72  Temp(Src) 97.9 F (36.6 C) (Oral)  Resp 16  Ht 5\' 5"  (1.651 m)  Wt 57.5 kg (126 lb 12.2 oz)  BMI 21.09 kg/m2  SpO2 97%   PPS: 20%  Pain Score: denies  Intake/Output Summary (Last 24 hours) at 01/07/14 1250 Last data filed at 01/07/14 0646  Gross per 24 hour  Intake     20 ml   Output    175 ml  Net   -155 ml       Physical Exam:  General: ill appearing, cachetic  Chest:   Decreased in bases CVS: RRR Neuro: lethargic, alert and oriented  Labs: CBC    Component Value Date/Time   WBC 13.6* 01/06/2014 0503   RBC 2.90* 01/06/2014 0503   RBC 2.39* 12/27/2013 2122   HGB 9.3* 01/06/2014 0503   HCT 28.6* 01/06/2014 0503   PLT 46* 01/06/2014 0503   MCV 98.6 01/06/2014 0503   MCH 32.1 01/06/2014 0503   MCHC 32.5 01/06/2014 0503   RDW 21.1* 01/06/2014 0503   LYMPHSABS 0.6* 01/04/2014 2300   MONOABS 1.7* 01/04/2014 2300   EOSABS 0.0 01/04/2014 2300   BASOSABS 0.0 01/04/2014 2300    BMET    Component Value Date/Time   NA 141 01/06/2014 0503   K 3.9 01/06/2014 0503   CL 95* 01/06/2014 0503   CO2 24 01/06/2014 0503   GLUCOSE 100* 01/06/2014 0503   BUN 74* 01/06/2014 0503   CREATININE 5.18* 01/06/2014 0503   CALCIUM 9.7 01/06/2014 0503   GFRNONAA 8* 01/06/2014 0503   GFRAA 9* 01/06/2014 0503    CMP     Component Value Date/Time   NA 141 01/06/2014 0503   K 3.9 01/06/2014 0503   CL 95* 01/06/2014 0503   CO2 24 01/06/2014 0503   GLUCOSE 100* 01/06/2014 0503   BUN 74* 01/06/2014 0503  CREATININE 5.18* 01/06/2014 0503   CALCIUM 9.7 01/06/2014 0503   PROT 4.6* 01/05/2014 0432   ALBUMIN 1.5* 01/05/2014 0432   AST 127* 01/05/2014 0432   ALT 30 01/05/2014 0432   ALKPHOS 361* 01/05/2014 0432   BILITOT 3.9* 01/05/2014 0432   GFRNONAA 8* 01/06/2014 0503   GFRAA 9* 01/06/2014 0503     Assessment and Plan:  1.  Chronic renal failure in setting of liver cirrhosis, hepatorenal syndrome.   Patient and family understand the limited prognosis and hope is for comfort and dignity at this time.  Hopeful for inpatient hospice facility, will write for choice.     Time In Time Out Total Time Spent with Patient Total Overall Time  1200 1235 35 min 35 min    Greater than 50%  of this time was spent counseling and coordinating care related to the above  assessment and plan.  Wadie Lessen NP  Palliative Medicine Team Team Phone # [phone] Pager [phone]  Discussed with Dr Tana Coast 1

## 2014-01-08 MED ORDER — MIDODRINE HCL 10 MG PO TABS: 10.0000 mg | ORAL_TABLET | Freq: Three times a day (TID) | ORAL | Status: AC

## 2014-01-08 MED ORDER — SEVELAMER CARBONATE 800 MG PO TABS: 800.0000 mg | ORAL_TABLET | Freq: Three times a day (TID) | ORAL | Status: AC

## 2014-01-08 MED ORDER — ACETAMINOPHEN 325 MG PO TABS: 650.0000 mg | ORAL_TABLET | Freq: Four times a day (QID) | ORAL | Status: AC | PRN

## 2014-01-08 MED ORDER — LACTULOSE 10 GM/15ML PO SOLN: 10.0000 g | Freq: Every day | ORAL | Status: AC

## 2014-01-08 MED ORDER — ADULT MULTIVITAMIN LIQUID CH: 5.0000 mL | Freq: Every day | ORAL | Status: AC

## 2014-01-08 MED ORDER — MORPHINE SULFATE (CONCENTRATE) 10 MG /0.5 ML PO SOLN: 5.0000 mg | ORAL | Status: AC | PRN

## 2014-01-08 NOTE — Progress Notes (Signed)
Patient ID: JAIANA SHEFFER  female  MHD:[id number]    DOB: 12/22/1945    DOA: 01/04/2014  PCP: Pcp Not In System  Brief history of present illness  Patient is a 68 year old female with chronic renal failure, cirrhosis diagnosed in 12/2013, history of alcohol use who presented to ED with feeling of generalized weakness. 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 Patient was having poor by mouth intake and her family not herself and swollen. Patient's daughter had reported that patient's legs and abdominal swelling had worsened, had difficulty ambulating, short of breath. She had quit alcohol years ago. Patient was noted to have creatinine of 5.29, (2.9 at the time of discharge on 11/19). Lactic acid 11.0, elevated LFTs, alkaline phosphatase 400, AST 98, ALT 23, bilirubin 3.7, albumin 1.0.  Patient was admitted by critical care service for dyspnea, pulmonary edema due to third spacing from severe to low albumin, hypotension (systolic BP 21J) likely due to decompensated liver failure and third spacing, hepatorenal syndrome in the setting of liver cirrhosis and adenocarcinoma.  Patient was transferred to the floor on 01/06/14, hospitalist service assumed care on 01/07/14.    Assessment/Plan: Active Problems:   AKI (acute kidney injury) on  chronic renal failure in the setting of liver cirrhosis, hepatorenal syndrome - No significant improvement, creatinine 5.1 on 11/24, 5.29 at the time of admission - Patient was seen by nephrology on 11/23, recommended supportive care, poor candidate for RRT given her comorbidities  Hypotension - Continue midodrine  Liver failure, transaminitis, in the setting of adenocarcinoma and cirrhosis, mild hepatic encephalopathy - Continue lactulose - Not a candidate for systemic chemotherapy - Per oncology consult, poor prognosis, hospice evaluation to concentrate on quality of life, comfort care - 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    Protein-calorie malnutrition, failure to thrive, cachexia -  Overall poor prognosis, family now agreeable for residential hospice, awaiting [facility]  DVT Prophylaxis:  Code Status: partial code  Family Communication: Discussed with daughter at the bedside  Disposition: Optometrist when bed available   Consultants Nephrology  Oncology   Antibiotics:  none    Subjective:Patient seen and examined, no significant complaints by the patient, comfortable, no pain or shortness of breath daughter at the bedside Objective Weight change:   Intake/Output Summary (Last 24 hours) at 01/08/14 1014 Last data filed at 01/07/14 1726  Gross per 24 hour  Intake     70 ml  Output     25 ml  Net     45 ml   Blood pressure 93/57, pulse 74, temperature 97.5 F (36.4 C), temperature source Oral, resp. rate 16, height $RemoveBe'5\' 5"'ldTlOdqjm$  (1.651 m), weight 57.5 kg (126 lb 12.2 oz), SpO2 97 %.  Physical Exam: General: Alert and awake, oriented, NAD, cachectic  CVS: S1-S2 clear, no murmur rubs or gallops Chest: Decreased breath sounds at the bases Abdomen: soft distended, hepatomegaly, hypoactive bowel sounds   Extremities: no cyanosis, clubbing, 2-3+ edema noted bilaterally   Lab Results: Basic Metabolic Panel:  Recent Labs Lab 01/05/14 0432 01/06/14 0503  NA 142 141  K 4.7 3.9  CL 96 95*  CO2 22 24  GLUCOSE 119* 100*  BUN 78* 74*  CREATININE 5.17* 5.18*  CALCIUM 9.4 9.7  MG 2.6*  --   PHOS 7.1*  --    Liver Function Tests:  Recent Labs Lab 01/04/14 2324 01/05/14 0432  AST 98* 127*  ALT 23 30  ALKPHOS 400* 361*  BILITOT 3.7* 3.9*  PROT 4.4* 4.6*  ALBUMIN 1.0* 1.5*   No results for input(s): LIPASE, AMYLASE in the last 168 hours.  Recent Labs Lab 01/04/14 2326  AMMONIA 85*   CBC:  Recent Labs Lab 01/04/14 2300  01/05/14 0432 01/06/14 0503  WBC 15.3*  --  14.4* 13.6*  NEUTROABS 13.0*   --   --   --   HGB 11.1*  < > 8.6* 9.3*  HCT 34.1*  < > 26.3* 28.6*  MCV 97.7  --  95.6 98.6  PLT 108*  --  59* 46*  < > = values in this interval not displayed. Cardiac Enzymes: No results for input(s): CKTOTAL, CKMB, CKMBINDEX, TROPONINI in the last 168 hours. BNP: Invalid input(s): POCBNP CBG:  Recent Labs Lab 01/04/14 2305 01/04/14 2309 01/05/14 0017 01/05/14 0257 01/05/14 0822  GLUCAP 15*  15* 11*  11* 88 124* 117*     Micro Results: Recent Results (from the past 240 hour(s))  Blood culture (routine x 2)     Status: None (Preliminary result)   Collection Time: 01/04/14 11:27 PM  Result Value Ref Range Status   Specimen Description LEFT ANTECUBITAL  Final   Special Requests BOTTLES DRAWN AEROBIC AND ANAEROBIC 5CC EACH  Final   Culture  Setup Time   Final    01/05/2014 10:13 Performed at [facility]    Culture   Final           BLOOD CULTURE RECEIVED NO GROWTH TO DATE CULTURE WILL BE HELD FOR 5 DAYS BEFORE ISSUING A FINAL NEGATIVE REPORT Performed at [facility]    Report Status PENDING  Incomplete  Blood culture (routine x 2)     Status: None (Preliminary result)   Collection Time: 01/04/14 11:38 PM  Result Value Ref Range Status   Specimen Description BLOOD LEFT HAND  Final   Special Requests BOTTLES DRAWN AEROBIC AND ANAEROBIC 5CC EACH  Final   Culture  Setup Time   Final    01/05/2014 10:13 Performed at [facility]    Culture   Final           BLOOD CULTURE RECEIVED NO GROWTH TO DATE CULTURE WILL BE HELD FOR 5 DAYS BEFORE ISSUING A FINAL NEGATIVE REPORT Performed at [facility]    Report Status PENDING  Incomplete  Urine culture     Status: None   Collection Time: 01/05/14 12:35 AM  Result Value Ref Range Status   Specimen Description URINE, CATHETERIZED  Final   Special Requests NONE  Final   Culture  Setup Time   Final    01/05/2014 12:08 Performed at [place] Performed  at [facility]   Final   Culture NO GROWTH Performed at [facility]   Final   Report Status 01/06/2014 FINAL  Final  MRSA PCR Screening     Status: None   Collection Time: 01/05/14  3:16 AM  Result Value Ref Range Status   MRSA by PCR NEGATIVE NEGATIVE Final    Comment:        The GeneXpert MRSA Assay (FDA approved for NASAL specimens only), is one component of a comprehensive MRSA colonization surveillance program. It is not intended to diagnose MRSA infection nor to guide or monitor treatment for MRSA infections.     Studies/Results: Ct Abdomen Pelvis Wo Contrast  12/27/2013   CLINICAL DATA:  Lower abdominal pain  for 10 days, status postcholecystectomy  EXAM: CT ABDOMEN AND PELVIS WITHOUT CONTRAST  TECHNIQUE: Multidetector CT imaging of the abdomen and pelvis was performed following the standard protocol without IV contrast.  COMPARISON:  07/28/2007.  FINDINGS: Lung bases are unremarkable. Sagittal images of the spine are unremarkable. There is small perihepatic ascites. There is hepatomegaly. The liver measures at least 22 cm in length. There is micro nodular liver contour highly suspicious for chronic liver disease or early cirrhosis.  The study is limited without IV contrast. The patient is status postcholecystectomy.  Atherosclerotic calcifications of abdominal aorta and iliac arteries. Unenhanced pancreas and spleen is unremarkable. The spleen measures 8.9 cm in length.  Adrenal glands are unremarkable. Unenhanced kidneys are symmetrical in size. There is a probable cyst in midpole of the right kidney measures 2 cm. No nephrolithiasis. No hydronephrosis or hydroureter. No calcified ureteral calculi are noted. Small ascites is noted in right paracolic gutter.  Small pelvic ascites. Oral contrast material was given to the patient. No small bowel obstruction. No colonic obstruction. Contrast material noted all the way in rectosigmoid colon.  There is small left  inguinal hernia containing ascites measures about 1.2 cm. There is old fracture deformity of the left inferior pubic ramus.  The uterus is not identified.  No inguinal adenopathy.  IMPRESSION: 1. There is perihepatic ascites. Significant hepatomegaly. There is micro nodular liver contour. Chronic liver disease or cirrhosis cannot be excluded. Clinical correlation is necessary. 2. The patient is status post cholecystectomy.  No splenomegaly. 3. There is a cyst in midpole of the right kidney measures 2 cm. No nephrolithiasis. No hydronephrosis or hydroureter. 4. Atherosclerotic calcifications of abdominal aorta and iliac arteries. 5. Small pelvic ascites. The uterus is not identified. No small bowel or colonic obstruction.   Electronically Signed   By: Lahoma Crocker M.D.   On: 12/27/2013 18:31   Dg Chest 2 View  12/27/2013   CLINICAL DATA:  Lower abdominal pain and lower extremity swelling  EXAM: CHEST  2 VIEW  COMPARISON:  11/02/2007  FINDINGS: Cardiac shadow is within normal limits. The lungs are hyperaerated bilaterally. No focal infiltrate or sizable effusion is seen.  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   Electronically Signed   By: Inez Catalina M.D.   On: 12/27/2013 16:16   US Abdomen Complete  12/28/2013   CLINICAL DATA:  Elevated liver function tests. Acute renal insufficiency. Ethanol abuse.  EXAM: ULTRASOUND ABDOMEN COMPLETE  COMPARISON:  12/27/2013  FINDINGS: Gallbladder: Surgically absent  Common bile duct: Diameter: Normal, 5 mm.  Liver: Moderate cirrhosis. Heterogeneous echogenicity is likely related to cirrhosis and possibly heterogeneous steatosis.  IVC: No abnormality visualized.  Pancreas: Visualized portion unremarkable.  Spleen: Size and appearance within normal limits.  Right Kidney: Length: 7.4 cm. Atrophic with increased echogenicity. No hydronephrosis. 1.9 cm interpolar cyst.  Left Kidney: Length: 7.8 cm. Atrophic and hyperechoic. 1.4 cm lower pole cyst.  Abdominal aorta:  Atherosclerosis throughout the abdominal aorta.  Other findings: Small volume perihepatic ascites.  IMPRESSION: 1. Cirrhosis and small volume ascites. 2. Atrophic kidneys, most consistent with medical renal disease. 3.  Cholecystectomy, without biliary ductal dilatation.   Electronically Signed   By: Abigail Miyamoto M.D.   On: 12/28/2013 16:12   Ir Venogram Hepatic W Hemodynamic Evaluation  12/31/2013   INDICATION: New diagnosis of cirrhosis. Now with ascites. Please perform transjugular liver biopsy for tissue diagnostic purposes.  EXAM: FLUOROSCOPIC GUIDED TRANSJUGULAR LIVER BIOPSY  ULTRASOUND GUIDANCE FOR VENOUS ACCESS  COMPARISON:  CT abdomen pelvis -12/27/2013  MEDICATIONS: Fentanyl 50 mcg IV; Versed 1 mg IV  CONTRAST:  None.  CO2 was utilized given renal insufficiency.  ANESTHESIA/SEDATION: Total Moderate Sedation Time  30 minutes  FLUOROSCOPY TIME:  2 minutes.  36 seconds.  COMPLICATIONS: None immediate  TECHNIQUE: Informed written consent was obtained from the patient after a discussion of the risks, benefits and alternatives to treatment. Questions regarding the procedure were encouraged and answered. A timeout was performed prior to the initiation of the procedure.  The right neck was prepped and draped in the usual sterile fashion, and a sterile drape was applied covering the operative field. Maximum barrier sterile technique with sterile gowns and gloves were used for the procedure. A timeout was performed prior to the initiation of the procedure. Local anesthesia was provided with 1% lidocaine with epinephrine.  The right internal jugular vein was accessed with a micropuncture kit under direct ultrasound guidance. An ultrasound image was saved for documentation purposes. The micropuncture sheath was exchanged for a 9-French vascular sheath over a Benson wire. A 100 cm Bernstein catheter was advanced over a stiff Glidewire to select the middle hepatic vein. A CO2 hepatic venogram was performed. Free  hepatic and right atrial pressures were obtained with the Berenstein catheter. The Berenstein catheter was exchanged for a Dextera transjugular biopsy sheath. 3 core needle biopsies were obtained and anterior to the middle hepatic vein under direct fluoroscopic guidance. Samples were deemed adequate, placed in formalin and submitted to pathology. At this point the procedure was terminated. All wires, catheters and sheaths removed and the patient. Hemostasis was achieved at the right neck access site with manual compression. A dressing was placed. The patient tolerated the procedure well without immediate postprocedural complication.  FINDINGS: Normal hepatic venogram.  Pressure measurements as follows:  Free hepatic  12/5 (mean - 8)  Wedged hepatic  24/7 (mean - 17)  Gradient  9  Successful acquisition of 3 core needle biopsies anterior to the middle hepatic vein.  IMPRESSION: 1. No portosystemic gradient to suggest portal venous hypertension. 2. Successful fluoroscopic guided transjugular liver biopsy. 3. Unremarkable hepatic CO2 venogram.   Electronically Signed   By: Sandi Mariscal M.D.   On: 12/31/2013 11:44   Ir Transcatheter Bx  12/31/2013   INDICATION: New diagnosis of cirrhosis. Now with ascites. Please perform transjugular liver biopsy for tissue diagnostic purposes.  EXAM: FLUOROSCOPIC GUIDED TRANSJUGULAR LIVER BIOPSY  ULTRASOUND GUIDANCE FOR VENOUS ACCESS  COMPARISON:  CT abdomen pelvis -12/27/2013  MEDICATIONS: Fentanyl 50 mcg IV; Versed 1 mg IV  CONTRAST:  None.  CO2 was utilized given renal insufficiency.  ANESTHESIA/SEDATION: Total Moderate Sedation Time  30 minutes  FLUOROSCOPY TIME:  2 minutes.  36 seconds.  COMPLICATIONS: None immediate  TECHNIQUE: Informed written consent was obtained from the patient after a discussion of the risks, benefits and alternatives to treatment. Questions regarding the procedure were encouraged and answered. A timeout was performed prior to the initiation of the  procedure.  The right neck was prepped and draped in the usual sterile fashion, and a sterile drape was applied covering the operative field. Maximum barrier sterile technique with sterile gowns and gloves were used for the procedure. A timeout was performed prior to the initiation of the procedure. Local anesthesia was provided with 1% lidocaine with epinephrine.  The right internal jugular vein was accessed with a micropuncture kit under direct ultrasound guidance. An ultrasound image was saved for documentation purposes. The micropuncture sheath was exchanged for a  9-French vascular sheath over a Benson wire. A 100 cm Bernstein catheter was advanced over a stiff Glidewire to select the middle hepatic vein. A CO2 hepatic venogram was performed. Free hepatic and right atrial pressures were obtained with the Berenstein catheter. The Berenstein catheter was exchanged for a Dextera transjugular biopsy sheath. 3 core needle biopsies were obtained and anterior to the middle hepatic vein under direct fluoroscopic guidance. Samples were deemed adequate, placed in formalin and submitted to pathology. At this point the procedure was terminated. All wires, catheters and sheaths removed and the patient. Hemostasis was achieved at the right neck access site with manual compression. A dressing was placed. The patient tolerated the procedure well without immediate postprocedural complication.  FINDINGS: Normal hepatic venogram.  Pressure measurements as follows:  Free hepatic  12/5 (mean - 8)  Wedged hepatic  24/7 (mean - 17)  Gradient  9  Successful acquisition of 3 core needle biopsies anterior to the middle hepatic vein.  IMPRESSION: 1. No portosystemic gradient to suggest portal venous hypertension. 2. Successful fluoroscopic guided transjugular liver biopsy. 3. Unremarkable hepatic CO2 venogram.   Electronically Signed   By: Sandi Mariscal M.D.   On: 12/31/2013 11:44   Ir US Guide Vasc Access Right  12/31/2013    INDICATION: New diagnosis of cirrhosis. Now with ascites. Please perform transjugular liver biopsy for tissue diagnostic purposes.  EXAM: FLUOROSCOPIC GUIDED TRANSJUGULAR LIVER BIOPSY  ULTRASOUND GUIDANCE FOR VENOUS ACCESS  COMPARISON:  CT abdomen pelvis -12/27/2013  MEDICATIONS: Fentanyl 50 mcg IV; Versed 1 mg IV  CONTRAST:  None.  CO2 was utilized given renal insufficiency.  ANESTHESIA/SEDATION: Total Moderate Sedation Time  30 minutes  FLUOROSCOPY TIME:  2 minutes.  36 seconds.  COMPLICATIONS: None immediate  TECHNIQUE: Informed written consent was obtained from the patient after a discussion of the risks, benefits and alternatives to treatment. Questions regarding the procedure were encouraged and answered. A timeout was performed prior to the initiation of the procedure.  The right neck was prepped and draped in the usual sterile fashion, and a sterile drape was applied covering the operative field. Maximum barrier sterile technique with sterile gowns and gloves were used for the procedure. A timeout was performed prior to the initiation of the procedure. Local anesthesia was provided with 1% lidocaine with epinephrine.  The right internal jugular vein was accessed with a micropuncture kit under direct ultrasound guidance. An ultrasound image was saved for documentation purposes. The micropuncture sheath was exchanged for a 9-French vascular sheath over a Benson wire. A 100 cm Bernstein catheter was advanced over a stiff Glidewire to select the middle hepatic vein. A CO2 hepatic venogram was performed. Free hepatic and right atrial pressures were obtained with the Berenstein catheter. The Berenstein catheter was exchanged for a Dextera transjugular biopsy sheath. 3 core needle biopsies were obtained and anterior to the middle hepatic vein under direct fluoroscopic guidance. Samples were deemed adequate, placed in formalin and submitted to pathology. At this point the procedure was terminated. All wires,  catheters and sheaths removed and the patient. Hemostasis was achieved at the right neck access site with manual compression. A dressing was placed. The patient tolerated the procedure well without immediate postprocedural complication.  FINDINGS: Normal hepatic venogram.  Pressure measurements as follows:  Free hepatic  12/5 (mean - 8)  Wedged hepatic  24/7 (mean - 17)  Gradient  9  Successful acquisition of 3 core needle biopsies anterior to the middle hepatic vein.  IMPRESSION: 1. No portosystemic  gradient to suggest portal venous hypertension. 2. Successful fluoroscopic guided transjugular liver biopsy. 3. Unremarkable hepatic CO2 venogram.   Electronically Signed   By: Sandi Mariscal M.D.   On: 12/31/2013 11:44   Dg Chest Port 1 View  01/06/2014   CLINICAL DATA:  Respiratory failure.  EXAM: PORTABLE CHEST - 1 VIEW  COMPARISON:  01/04/2014, 12/27/2013, 11/02/2007.  FINDINGS: Right PICC line noted with tip cavoatrial junction. Mediastinum unremarkable. Persistent cardiomegaly with progressive pulmonary venous congestion. Bilateral pulmonary alveolar diffuse infiltrates consistent with pulmonary edema. Bilateral small pleural effusions. No pneumothorax. No acute bony abnormality.  IMPRESSION: 1. Congestive heart failure. Interval appearance of diffuse bilateral pulmonary alveolar edema. Bilateral pleural effusions. 2. PICC line in good anatomic position.   Electronically Signed   By: Marcello Moores  Register   On: 01/06/2014 07:17   Dg Abd Acute W/chest  01/05/2014   CLINICAL DATA:  Abdominal pain.  History of renal disorder  EXAM: ACUTE ABDOMEN SERIES (ABDOMEN 2 VIEW & CHEST 1 VIEW)  COMPARISON:  None.  FINDINGS: No abnormal bowel dilatation identified. Cholecystectomy clips noted in the right upper quadrant. The heart size appears normal. There are small bilateral pleural effusions and mild interstitial edema.  IMPRESSION: 1. Congestive heart failure. 2. Nonobstructive bowel gas pattern.   Electronically Signed    By: Kerby Moors M.D.   On: 01/05/2014 00:12    Medications: Scheduled Meds: . feeding supplement (NEPRO CARB STEADY)  237 mL Oral TID BM  . folic acid  1 mg Oral Daily  . lactulose  10 g Oral Daily  . midodrine  10 mg Oral TID WC  . multivitamin  5 mL Oral Daily  . sevelamer carbonate  800 mg Oral TID WC  . thiamine  100 mg Oral Daily      LOS: 4 days   RAI,RIPUDEEP M.D. [facility] 01/08/2014, 10:14 AM Pager: [phone]  If 7PM-7AM, please contact night-coverage [email or web address] Password [email or web address]

## 2014-01-08 NOTE — Consult Note (Signed)
[facility] Liaison: Continue to follow for family interest in [facility]. Unfortunately no changes and [facility] remains full today. Will continue to follow, update CSW if availability changes. Thank you. Erling Conte LCSW [phone]

## 2014-01-09 NOTE — Discharge Summary (Signed)
Physician Discharge Summary  Patient ID: [name] MRN: [id number] DOB/AGE: 07-05-1945 68 y.o.  Admit date: 01/04/2014 Discharge date: 01/09/2014  Primary Care Physician:  Pcp Not In System  Discharge Diagnoses:    . Acute liver failure . Protein-calorie malnutrition, severe . 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 Transaminitis . AKI (acute kidney injury) with chronic renal failure in the setting of liver cirrhosis, hepatorenal syndrome  . Hepatic cirrhosis Hypotension Severe protein calorie malnutrition with failure to thrive   Consults:  Critical care Nephrology, Dr Melvia Heaps Oncology, Dr. Learta Codding Palliative medicine   Recommendations for Outpatient Follow-up:  Patient is currently comfort care, hospice goals of care  Allergies:   Allergies  Allergen Reactions  . Percocet [Oxycodone-Acetaminophen] Nausea Only  . Vicodin [Hydrocodone-Acetaminophen] Nausea Only     Discharge Medications:   Medication List    STOP taking these medications        pantoprazole 40 MG tablet  Commonly known as:  PROTONIX      TAKE these medications        acetaminophen 325 MG tablet  Commonly known as:  TYLENOL  Take 2 tablets (650 mg total) by mouth every 6 (six) hours as needed for mild pain (temp > 101.5).     feeding supplement (ENSURE COMPLETE) Liqd  Take 237 mLs by mouth 2 (two) times daily between meals.     folic acid 1 MG tablet  Commonly known as:  FOLVITE  Take 1 tablet (1 mg total) by mouth daily.     lactulose 10 GM/15ML solution  Commonly known as:  CHRONULAC  Take 15 mLs (10 g total) by mouth daily.     midodrine 10 MG tablet  Commonly known as:  PROAMATINE  Take 1 tablet (10 mg total) by mouth 3 (three) times daily with meals.     morphine CONCENTRATE 10 mg / 0.5 ml concentrated solution  Place 0.25 mLs (5 mg total) under the tongue every 4 (four) hours as needed for severe pain or shortness of breath.     multivitamin Liqd   Take 5 mLs by mouth daily.     ondansetron 4 MG tablet  Commonly known as:  ZOFRAN  Take 1 tablet (4 mg total) by mouth every 6 (six) hours as needed for nausea.     sevelamer carbonate 800 MG tablet  Commonly known as:  RENVELA  Take 1 tablet (800 mg total) by mouth 3 (three) times daily with meals.     thiamine 100 MG tablet  Take 1 tablet (100 mg total) by mouth daily.         Brief H and P: For complete details please refer to admission H and P, but in brief the patient is a 68 year old female presented to ED complaining of generalized weakness. She was recently discharged on 01/01/14 after her liver biopsy that showed adenocarcinoma. Patient was having poor by mouth intake at home, not herself and very swollen. Patient's daughters noted that patient's leg and abdominal swelling was worsening and she was having difficulty in breathing. Patient's body temperature was running low and was making little urine. Daughter also reported that patient appeared short of breath. Otherwise denies vomiting, fevers or chills. Patient was noted to have acute on chronic renal failure in the setting of cirrhosis. Creatinine was 5.2 at the time of admission, potassium 5.5, BUN 78 patient was admitted by critical care service to ICU.  Hospital Course:  Patient is a 68 year old female with chronic renal failure, cirrhosis  diagnosed in 12/2013, history of alcohol use who presented to ED with feeling of generalized weakness. 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 Patient was having poor PO intake and her family noticed that she was "not herself and swollen". Patient's daughter had reported that patient's legs and abdominal swelling had worsened, had difficulty ambulating and was short of breath. She had quit alcohol a year ago. Patient was noted to have creatinine of 5.29, (2.9 at the time of discharge on 11/19). Lactic acid 11.0, elevated LFTs, alkaline  phosphatase 400, AST 98, ALT 23, bilirubin 3.7, albumin 1.0.  Patient was admitted by critical care service for dyspnea, pulmonary edema due to third spacing from severe to low albumin, hypotension (systolic BP 16X) likely due to decompensated liver failure and third spacing, hepatorenal syndrome in the setting of liver cirrhosis and adenocarcinoma.  Patient was transferred to the floor on 01/06/14, hospitalist service assumed care on 01/07/14.   AKI (acute kidney injury) on chronic renal failure in the setting of liver cirrhosis, hepatorenal syndrome  No significant improvement, creatinine 5.1 on 11/24, 5.29 at the time of admission  Patient was seen by nephrology on 11/23, recommended supportive care, poor candidate for RRT given her comorbidities  Hypotension- Continue midodrine  Liver failure, transaminitis, in the setting of adenocarcinoma and cirrhosis, mild hepatic encephalopathy - Continue lactulose, she is not a candidate for systemic chemotherapy. Oncology was consulted and patient was seen by Dr. Learta Codding who recommended hospice evaluation to concentrate on quality of life, comfort care in the light of poor prognosis. Biopsy final results showed adenocarcinoma consistent with the pancreaticobiliary tumor. Palliative medicine consult was obtained, family agreed for residential hospice.   Protein-calorie malnutrition, failure to thrive, cachexia  Overall poor prognosis, family now agreeable for residential hospice [facility]    Day of Discharge BP 98/51 mmHg  Pulse 71  Temp(Src) 98.4 F (36.9 C) (Axillary)  Resp 16  Ht 5\' 5"  (1.651 m)  Wt 57.5 kg (126 lb 12.2 oz)  BMI 21.09 kg/m2  SpO2 98%  Physical Exam: General: Alert and awake oriented x3 not in any acute distress. CVS: S1-S2 clear no murmur rubs or gallops Chest: clear to auscultation bilaterally, no wheezing rales or rhonchi Abdomen: soft and distended, hepatomegaly, hypoactive bowel sounds  Extremities: no  cyanosis, clubbing, 2+ edema noted bilaterally    The results of significant diagnostics from this hospitalization (including imaging, microbiology, ancillary and laboratory) are listed below for reference.    LAB RESULTS: Basic Metabolic Panel:  Recent Labs Lab 01/05/14 0432 01/06/14 0503  NA 142 141  K 4.7 3.9  CL 96 95*  CO2 22 24  GLUCOSE 119* 100*  BUN 78* 74*  CREATININE 5.17* 5.18*  CALCIUM 9.4 9.7  MG 2.6*  --   PHOS 7.1*  --    Liver Function Tests:  Recent Labs Lab 01/04/14 2324 01/05/14 0432  AST 98* 127*  ALT 23 30  ALKPHOS 400* 361*  BILITOT 3.7* 3.9*  PROT 4.4* 4.6*  ALBUMIN 1.0* 1.5*   No results for input(s): LIPASE, AMYLASE in the last 168 hours.  Recent Labs Lab 01/04/14 2326  AMMONIA 85*   CBC:  Recent Labs Lab 01/04/14 2300  01/05/14 0432 01/06/14 0503  WBC 15.3*  --  14.4* 13.6*  NEUTROABS 13.0*  --   --   --   HGB 11.1*  < > 8.6* 9.3*  HCT 34.1*  < > 26.3* 28.6*  MCV 97.7  --  95.6 98.6  PLT 108*  --  59* 46*  < > = values in this interval not displayed. Cardiac Enzymes: No results for input(s): CKTOTAL, CKMB, CKMBINDEX, TROPONINI in the last 168 hours. BNP: Invalid input(s): POCBNP CBG:  Recent Labs Lab 01/05/14 0257 01/05/14 0822  GLUCAP 124* 117*    Significant Diagnostic Studies:  Dg Abd Acute W/chest  01/05/2014   CLINICAL DATA:  Abdominal pain.  History of renal disorder  EXAM: ACUTE ABDOMEN SERIES (ABDOMEN 2 VIEW & CHEST 1 VIEW)  COMPARISON:  None.  FINDINGS: No abnormal bowel dilatation identified. Cholecystectomy clips noted in the right upper quadrant. The heart size appears normal. There are small bilateral pleural effusions and mild interstitial edema.  IMPRESSION: 1. Congestive heart failure. 2. Nonobstructive bowel gas pattern.   Electronically Signed   By: Kerby Moors M.D.   On: 01/05/2014 00:12       Disposition and Follow-up:    DISPOSITION: Residential hospice, [facility]  DIET: Renal  diet   DISCHARGE FOLLOW-UP: Per facility M.D.   Time spent on Discharge: 35 minutes  Signed:   RAI,RIPUDEEP M.D. [facility] 01/09/2014, 10:53 AM Pager: [phone]

## 2014-01-09 NOTE — Progress Notes (Signed)
Nutrition Brief Note  Chart reviewed. Pt now transitioning to comfort care.  No further nutrition interventions warranted at this time.  Please re-consult as needed.   Mialynn Shelvin A. Kaitlan Bin, RD, LDN Pager: [phone] After hours Pager: [phone]  

## 2014-01-09 NOTE — Clinical Social Work Note (Addendum)
CSW updated by Erling Conte, LCSW [phone]) from [facility] that [facility] does not have bed availability. CSW faxed clinicals to [facility] in [place]. CSW is awaiting a call back regarding bed availability. CSW will continue to follow pt.     Addendum: CSW updated by Nira Conn from [facility] that they can take the pt today. MD and pt's daughter Lattie Haw is updated. CSW and Lattie Haw discussed ambulance transport. CSW contact [facility] at [phone] to schedule transport for the pt. CSW informed Water quality scientist at [facility]. CSW will upload the pt's discharge summary. Bedside RN can call reported to [phone].    [place], MSW, [place]

## 2014-01-09 NOTE — Progress Notes (Signed)
Report called to [facility]. EMS here for transport. Melford Aase, RN

## 2014-01-11 LAB — CULTURE, BLOOD (ROUTINE X 2)
CULTURE: NO GROWTH
Culture: NO GROWTH

## 2014-01-13 ENCOUNTER — Ambulatory Visit: Payer: Medicare Other | Admitting: Physician Assistant

## 2014-02-13 DEATH — deceased
